# Patient Record
Sex: Female | Born: 1995
Health system: Southern US, Community
[De-identification: ages and names within clinical notes are randomized; demographics above are authoritative.]

## PROBLEM LIST (undated history)

## (undated) DIAGNOSIS — Z915 Personal history of self-harm: Secondary | ICD-10-CM

## (undated) DIAGNOSIS — F913 Oppositional defiant disorder: Secondary | ICD-10-CM

## (undated) DIAGNOSIS — F419 Anxiety disorder, unspecified: Secondary | ICD-10-CM

## (undated) DIAGNOSIS — E669 Obesity, unspecified: Secondary | ICD-10-CM

## (undated) DIAGNOSIS — Z9151 Personal history of suicidal behavior: Secondary | ICD-10-CM

## (undated) DIAGNOSIS — N809 Endometriosis, unspecified: Secondary | ICD-10-CM

## (undated) DIAGNOSIS — F319 Bipolar disorder, unspecified: Secondary | ICD-10-CM

## (undated) DIAGNOSIS — F1910 Other psychoactive substance abuse, uncomplicated: Secondary | ICD-10-CM

## (undated) DIAGNOSIS — F39 Unspecified mood [affective] disorder: Secondary | ICD-10-CM

## (undated) HISTORY — PX: ADENOIDECTOMY: SUR15

## (undated) HISTORY — PX: TONSILLECTOMY AND ADENOIDECTOMY: SUR1326

---
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
## 2017-01-16 ENCOUNTER — Telehealth: Payer: Self-pay | Admitting: *Deleted

## 2017-01-16 ENCOUNTER — Encounter: Payer: Self-pay | Admitting: Women's Health

## 2017-01-16 ENCOUNTER — Ambulatory Visit (INDEPENDENT_AMBULATORY_CARE_PROVIDER_SITE_OTHER): Payer: Medicaid Other | Admitting: Women's Health

## 2017-01-16 VITALS — BP 128/60 | HR 76 | Ht 64.2 in | Wt 229.0 lb

## 2017-01-16 DIAGNOSIS — Z113 Encounter for screening for infections with a predominantly sexual mode of transmission: Secondary | ICD-10-CM

## 2017-01-16 DIAGNOSIS — N920 Excessive and frequent menstruation with regular cycle: Secondary | ICD-10-CM | POA: Diagnosis not present

## 2017-01-16 DIAGNOSIS — A599 Trichomoniasis, unspecified: Secondary | ICD-10-CM | POA: Diagnosis not present

## 2017-01-16 DIAGNOSIS — Z3202 Encounter for pregnancy test, result negative: Secondary | ICD-10-CM

## 2017-01-16 LAB — POCT WET PREP (WET MOUNT): Clue Cells Wet Prep Whiff POC: POSITIVE

## 2017-01-16 LAB — POCT URINE PREGNANCY: Preg Test, Ur: NEGATIVE

## 2017-01-16 MED ORDER — METRONIDAZOLE 500 MG PO TABS: 500.0000 mg | ORAL_TABLET | Freq: Two times a day (BID) | ORAL | 0 refills | Status: DC

## 2017-01-16 NOTE — Patient Instructions (Signed)

## 2017-01-16 NOTE — Progress Notes (Signed)
   [facility] Visit  Patient name: Molly Zimmerman MRN [id number]  Date of birth: 09/16/1995  CC & HPI:  Molly Zimmerman is a 21 y.o. 432P1001 Caucasian female presenting today for report of spotting x 1, brown. No abnormal d/c, itching/odor/irritation. Stopped taking coc's b/c she moved out of town, now is back. Does not want pregnancy right now. Is sexually active.  Patient's last menstrual period was 12/21/2016. The current method of family planning is none. Last pap <21yo  Pertinent History Reviewed:  Medical & Surgical Hx:   Past medical, surgical, family, and social history reviewed in electronic medical record Medications: Reviewed & Updated - see associated section Allergies: Reviewed in electronic medical record  Objective Findings:  Vitals: BP 128/60   Pulse 76   Ht 5' 4.2" (1.631 m)   Wt 229 lb (103.9 kg)   LMP 12/21/2016   BMI 39.06 kg/m  Body mass index is 39.06 kg/m.  Physical Examination: General appearance - alert, well appearing, and in no distress Pelvic - small amt thin white malodorous d/c  Results for orders placed or performed in visit on 01/16/17 (from the past 24 hour(s))  POCT Wet Prep Molly Zimmerman([place]et [place])   Collection Time: 01/16/17  2:44 PM  Result Value Ref Range   Source Wet Prep POC vaginal    WBC, Wet Prep HPF POC few    Bacteria Wet Prep HPF POC None (A) Few   BACTERIA WET PREP MORPHOLOGY POC     Clue Cells Wet Prep HPF POC Few (A) None   Clue Cells Wet Prep Whiff POC Positive Whiff    Yeast Wet Prep HPF POC None    KOH Wet Prep POC     Trichomonas Wet Prep HPF POC Present (A) Absent   UPT: neg  Assessment & Plan:  A:   Trichomonas  P:  Rx metronidazole bid x 7d for trich, no sex or etoh while taking  Pt to call me back w/ partner's info  No sex until after poc  GC/CT today  Restart coc's!  Condoms for STI prevention  Return in about 2 weeks (around 01/30/2017) for F/U. POC  Molly Zimmerman, Molly Zimmerman CNM, [facility] 01/16/2017 2:45  PM

## 2017-01-16 NOTE — Addendum Note (Signed)
Addended by: Federico FlakeNES, Jeancarlo Leffler A on: 01/16/2017 03:16 PM   Modules accepted: Orders

## 2017-01-17 NOTE — Telephone Encounter (Signed)
Metronidazole 500mg  BID x 7d for tx of trichomonas called into [facility] for pt's partner Sharlyne Pacasnthony Jomar Carter DOB 04/04/90, NKDA, no etoh while taking.  Cheral MarkerKimberly R. Rondell Pardon, CNM, [facility]NP-BC 01/17/2017 12:55 PM

## 2017-01-18 ENCOUNTER — Encounter (HOSPITAL_COMMUNITY): Payer: Self-pay

## 2017-01-18 ENCOUNTER — Emergency Department (HOSPITAL_COMMUNITY)
Admission: EM | Admit: 2017-01-18 | Discharge: 2017-01-18 | Disposition: A | Discharge status: Home / Self Care | Payer: Medicaid Other | Attending: Emergency Medicine | Admitting: Emergency Medicine

## 2017-01-18 DIAGNOSIS — Y999 Unspecified external cause status: Secondary | ICD-10-CM | POA: Insufficient documentation

## 2017-01-18 DIAGNOSIS — S70262A Insect bite (nonvenomous), left hip, initial encounter: Secondary | ICD-10-CM | POA: Diagnosis not present

## 2017-01-18 DIAGNOSIS — Y92821 Forest as the place of occurrence of the external cause: Secondary | ICD-10-CM | POA: Diagnosis not present

## 2017-01-18 DIAGNOSIS — X58XXXA Exposure to other specified factors, initial encounter: Secondary | ICD-10-CM | POA: Diagnosis not present

## 2017-01-18 DIAGNOSIS — Y939 Activity, unspecified: Secondary | ICD-10-CM | POA: Diagnosis not present

## 2017-01-18 DIAGNOSIS — R21 Rash and other nonspecific skin eruption: Secondary | ICD-10-CM | POA: Diagnosis present

## 2017-01-18 DIAGNOSIS — W57XXXA Bitten or stung by nonvenomous insect and other nonvenomous arthropods, initial encounter: Secondary | ICD-10-CM

## 2017-01-18 DIAGNOSIS — F17200 Nicotine dependence, unspecified, uncomplicated: Secondary | ICD-10-CM | POA: Insufficient documentation

## 2017-01-18 LAB — GC/CHLAMYDIA PROBE AMP
Chlamydia trachomatis, NAA: NEGATIVE
Neisseria gonorrhoeae by PCR: NEGATIVE

## 2017-01-18 MED ORDER — PREDNISONE 20 MG PO TABS: 40.0000 mg | ORAL_TABLET | Freq: Every day | ORAL | 0 refills | Status: DC

## 2017-01-18 MED ORDER — DIPHENHYDRAMINE HCL 25 MG PO TABS: 25.0000 mg | ORAL_TABLET | ORAL | 0 refills | Status: DC | PRN

## 2017-01-18 NOTE — Discharge Instructions (Signed)
Apply ice packs on/off to the insect bite to your hip.  Avoid scratching.  Return if needed

## 2017-01-18 NOTE — ED Provider Notes (Signed)
[facility] Provider Note   CSN: [id number] Arrival date & time: 01/18/17  0913     History   Chief Complaint Chief Complaint  Patient presents with  . Insect Bite    HPI Molly Zimmerman is a 21 y.o. female.  HPI   Molly Zimmerman is a 21 y.o. female who presents to the Emergency Department complaining of multiple insect bites x 2 days.  She states she was outside in a wooded area few days ago and later noticed several red raised "bumps" to her left hip, leg, face, lower abdomen and wrist.  Complains of itching to the affected areas.  No other household members have rashes.  Denies fever, tick bites, joint pains, swelling or difficulty breathing.  Has not tried any therapies prior to arrival   Past Medical History:  Diagnosis Date  . Anxiety   . Bipolar 1 disorder (HCC)   . History of suicide attempt   . Mood disorder (HCC)   . Obesity   . ODD (oppositional defiant disorder)   . Polysubstance abuse     Patient Active Problem List   Diagnosis Date Noted  . Trichomonas infection 01/16/2017  . Irregular intermenstrual bleeding 08/07/2016  . Chlamydia 07/21/2015  . Encounter for IUD removal 11/17/2014  . History of suicide attempt 12/03/2013  . Marijuana use 10/13/2013  . Polysubstance abuse 09/26/2012  . Oppositional defiant behavior 09/24/2012  . Bipolar 1 disorder, mixed, moderate (HCC) 09/24/2012    Past Surgical History:  Procedure Laterality Date  . ADENOIDECTOMY  Age 21  . TONSILLECTOMY AND ADENOIDECTOMY  Age5    OB History    Gravida Para Term Preterm AB Living   2 1 1     1    SAB TAB Ectopic Multiple Live Births           1       Home Medications    Prior to Admission medications   Medication Sig Start Date End Date Taking? Authorizing Provider  metroNIDAZOLE (FLAGYL) 500 MG tablet Take 1 tablet (500 mg total) by mouth 2 (two) times daily. X 7 days. No sex or alcohol while taking 01/16/17  Yes Cheral MarkerBooker, Kimberly R, CNM  diphenhydrAMINE (BENADRYL)  25 MG tablet Take 1 tablet (25 mg total) by mouth every 4 (four) hours as needed. For itching 01/18/17   Keshan Reha, PA-C  predniSONE (DELTASONE) 20 MG tablet Take 2 tablets (40 mg total) by mouth daily. 01/18/17   Pauline Ausriplett, Paydon Carll, PA-C    Family History Family History  Problem Relation Age of Onset  . ADD / ADHD Father   . Drug abuse Father   . Diabetes Maternal Grandfather   . Heart disease Paternal Grandfather   . Cancer Paternal Grandfather        MGM sister  . Drug abuse Paternal Grandmother     Social History Social History  Substance Use Topics  . Smoking status: Current Every Day Smoker    Packs/day: 0.25  . Smokeless tobacco: Never Used  . Alcohol use No     Allergies   Patient has no known allergies.   Review of Systems Review of Systems  Constitutional: Negative for activity change, appetite change, chills and fever.  HENT: Negative for facial swelling, sore throat and trouble swallowing.   Respiratory: Negative for chest tightness, shortness of breath and wheezing.   Musculoskeletal: Negative for arthralgias, neck pain and neck stiffness.  Skin: Negative for rash and wound.       Insect bites  Neurological: Negative for dizziness, weakness, numbness and headaches.  All other systems reviewed and are negative.    Physical Exam Updated Vital Signs BP 111/73 (BP Location: Left Arm)   Pulse 66   Temp 97.7 F (36.5 C) (Oral)   Resp 18   Ht 5\' 4"  (1.626 m)   Wt 104.3 kg (230 lb)   LMP 12/23/2016   SpO2 97%   BMI 39.48 kg/m   Physical Exam  Constitutional: She is oriented to person, place, and time. She appears well-developed and well-nourished. No distress.  HENT:  Head: Normocephalic and atraumatic.  Mouth/Throat: Oropharynx is clear and moist.  Neck: Normal range of motion. Neck supple.  Cardiovascular: Normal rate, regular rhythm, normal heart sounds and intact distal pulses.   No murmur heard. Pulmonary/Chest: Effort normal and breath  sounds normal. No respiratory distress.  Musculoskeletal: She exhibits no edema or tenderness.  Lymphadenopathy:    She has no cervical adenopathy.  Neurological: She is alert and oriented to person, place, and time. She exhibits normal muscle tone. Coordination normal.  Skin: Skin is warm. Capillary refill takes less than 2 seconds. No rash noted. There is erythema.  Erythematous papules to the lower abdomen, left wrist, right face, left leg and left hip.  Surrounding erythema with papules to the left hip.  No vesicles or pustules  Nursing note and vitals reviewed.    ED Treatments / Results  Labs (all labs ordered are listed, but only abnormal results are displayed) Labs Reviewed - No data to display  EKG  EKG Interpretation None       Radiology No results found.  Procedures Procedures (including critical care time)  Medications Ordered in ED Medications - No data to display   Initial Impression / Assessment and Plan / ED Course  I have reviewed the triage vital signs and the nursing notes.  Pertinent labs & imaging results that were available during my care of the patient were reviewed by me and considered in my medical decision making (see chart for details).     Pt well appearing.  Non-toxic.  Appears c/w insect bites.  Localized erythema of the left hip likely inflammatory.  Doubt cellulitis.  No concerning sx's for herpes.    Final Clinical Impressions(s) / ED Diagnoses   Final diagnoses:  Insect bite, initial encounter    New Prescriptions Discharge Medication List as of 01/18/2017 10:03 AM    START taking these medications   Details  diphenhydrAMINE (BENADRYL) 25 MG tablet Take 1 tablet (25 mg total) by mouth every 4 (four) hours as needed. For itching, Starting Fri 01/18/2017, Print    predniSONE (DELTASONE) 20 MG tablet Take 2 tablets (40 mg total) by mouth daily., Starting Fri 01/18/2017, Print         Evert Wenrich, [place], PA-C 01/18/17 1106      Samuel JesterMcManus, Kathleen, DO 01/20/17 1818

## 2017-01-18 NOTE — ED Triage Notes (Signed)
Pt reports left hip, abdominal, left leg insect bite x2 days. Areas itchy, and red. Pt has history of cellulitis and she is concerned this will lead to that.

## 2017-01-31 ENCOUNTER — Ambulatory Visit: Payer: Medicaid Other | Admitting: Women's Health

## 2017-02-12 ENCOUNTER — Ambulatory Visit: Payer: Medicaid Other | Admitting: Women's Health

## 2017-02-15 ENCOUNTER — Encounter: Payer: Self-pay | Admitting: Women's Health

## 2017-02-15 ENCOUNTER — Ambulatory Visit (INDEPENDENT_AMBULATORY_CARE_PROVIDER_SITE_OTHER): Payer: Medicaid Other | Admitting: Women's Health

## 2017-02-15 VITALS — BP 120/70 | HR 80 | Wt 236.0 lb

## 2017-02-15 DIAGNOSIS — Z30015 Encounter for initial prescription of vaginal ring hormonal contraceptive: Secondary | ICD-10-CM | POA: Diagnosis not present

## 2017-02-15 DIAGNOSIS — A599 Trichomoniasis, unspecified: Secondary | ICD-10-CM | POA: Diagnosis not present

## 2017-02-15 LAB — POCT WET PREP (WET MOUNT)
Clue Cells Wet Prep Whiff POC: NEGATIVE
Trichomonas Wet Prep HPF POC: ABSENT

## 2017-02-15 MED ORDER — ETONOGESTREL-ETHINYL ESTRADIOL 0.12-0.015 MG/24HR VA RING: VAGINAL_RING | VAGINAL | 3 refills | Status: DC

## 2017-02-15 NOTE — Progress Notes (Signed)
   [facility] Visit  Patient name: Molly Zimmerman MRN [id number]  Date of birth: September 25, 1995  CC & HPI:  Molly Zimmerman is a 21 y.o. [id number] Caucasian female presenting today for trichomonas poc.  Patient's last menstrual period was 01/19/2017. The current method of family planning is Ortho-Evra patches weekly rx'd by PCP- hasn't started them yet, discussed they are not as effective w/ wt >198lb. Discussed options- pt wants nuva ring. Smokes 1-2 cigarettes/day. No h/o HTN, DVT/PE, CVA, MI, or migraines w/ aura.  Last pap <23yo  Pertinent History Reviewed:  Medical & Surgical Hx:   Past medical, surgical, family, and social history reviewed in electronic medical record Medications: Reviewed & Updated - see associated section Allergies: Reviewed in electronic medical record  Objective Findings:  Vitals: BP 120/70   Pulse 80   Wt 236 lb (107 kg)   LMP 01/19/2017   BMI 40.51 kg/m  Body mass index is 40.51 kg/m.  Physical Examination: General appearance - alert, well appearing, and in no distress Pelvic - cx normal, small amount white nonodorous d/c  Results for orders placed or performed in visit on 02/15/17 (from the past 24 hour(s))  POCT Wet Prep Molly Zimmerman)   Collection Time: 02/15/17  2:06 PM  Result Value Ref Range   Source Wet Prep POC vaginal    WBC, Wet Prep HPF POC few    Bacteria Wet Prep HPF POC None (A) Few   BACTERIA WET PREP MORPHOLOGY POC     Clue Cells Wet Prep HPF POC None None   Clue Cells Wet Prep Whiff POC Negative Whiff    Yeast Wet Prep HPF POC None    KOH Wet Prep POC     Trichomonas Wet Prep HPF POC Absent Absent     Assessment & Plan:  A:   Trichomonas poc neg  Contraception management  P:  Do not start ortho evra patches d/t ineffectiveness at wt >198lb  Rx nuva ring 3 w/ 3RF, instructed on proper use  Return in about 3 months (around 05/18/2017) for F/U.  Marge Duncans CNM, [facility] 02/15/2017 2:06 PM

## 2017-02-15 NOTE — Patient Instructions (Signed)
Ethinyl Estradiol; Etonogestrel vaginal ring What is this medicine? ETHINYL ESTRADIOL; ETONOGESTREL (ETH in il es tra DYE ole; et oh noe JES trel) vaginal ring is a flexible, vaginal ring used as a contraceptive (birth control method). This medicine combines two types of female hormones, an estrogen and a progestin. This ring is used to prevent ovulation and pregnancy. Each ring is effective for one month. This medicine may be used for other purposes; ask your health care provider or pharmacist if you have questions. COMMON BRAND NAME(S): NuvaRing What should I tell my health care provider before I take this medicine? They need to know if you have or ever had any of these conditions: -abnormal vaginal bleeding -blood vessel disease or blood clots -breast, cervical, endometrial, ovarian, liver, or uterine cancer -diabetes -gallbladder disease -heart disease or recent heart attack -high blood pressure -high cholesterol -kidney disease -liver disease -migraine headaches -stroke -systemic lupus erythematosus (SLE) -tobacco smoker -an unusual or allergic reaction to estrogens, progestins, other medicines, foods, dyes, or preservatives -pregnant or trying to get pregnant -breast-feeding How should I use this medicine? Insert the ring into your vagina as directed. Follow the directions on the prescription label. The ring will remain place for 3 weeks and is then removed for a 1-week break. A new ring is inserted 1 week after the last ring was removed, on the same day of the week. Check often to make sure the ring is still in place, especially before and after sexual intercourse. If the ring was out of the vagina for an unknown amount of time, you may not be protected from pregnancy. Perform a pregnancy test and call your doctor. Do not use more often than directed. A patient package insert for the product will be given with each prescription and refill. Read this sheet carefully each time. The  sheet may change frequently. Contact your pediatrician regarding the use of this medicine in children. Special care may be needed. This medicine has been used in female children who have started having menstrual periods. Overdosage: If you think you have taken too much of this medicine contact a poison control center or emergency room at once. NOTE: This medicine is only for you. Do not share this medicine with others. What if I miss a dose? You will need to replace your vaginal ring once a month as directed. If the ring should slip out, or if you leave it in longer or shorter than you should, contact your health care professional for advice. What may interact with this medicine? Do not take this medicine with the following medication: -dasabuvir; ombitasvir; paritaprevir; ritonavir -ombitasvir; paritaprevir; ritonavir This medicine may also interact with the following medications: -acetaminophen -antibiotics or medicines for infections, especially rifampin, rifabutin, rifapentine, and griseofulvin, and possibly penicillins or tetracyclines -aprepitant -ascorbic acid (vitamin C) -atorvastatin -barbiturate medicines, such as phenobarbital -bosentan -carbamazepine -caffeine -clofibrate -cyclosporine -dantrolene -doxercalciferol -felbamate -grapefruit juice -hydrocortisone -medicines for anxiety or sleeping problems, such as diazepam or temazepam -medicines for diabetes, including pioglitazone -modafinil -mycophenolate -nefazodone -oxcarbazepine -phenytoin -prednisolone -ritonavir or other medicines for HIV infection or AIDS -rosuvastatin -selegiline -soy isoflavones supplements -St. John's wort -tamoxifen or raloxifene -theophylline -thyroid hormones -topiramate -warfarin This list may not describe all possible interactions. Give your health care provider a list of all the medicines, herbs, non-prescription drugs, or dietary supplements you use. Also tell them if you smoke,  drink alcohol, or use illegal drugs. Some items may interact with your medicine. What should I watch for while using   this medicine? Visit your doctor or health care professional for regular checks on your progress. You will need a regular breast and pelvic exam and Pap smear while on this medicine. Use an additional method of contraception during the first cycle that you use this ring. Do not use a diaphragm or female condom, as the ring can interfere with these birth control methods and their proper placement. If you have any reason to think you are pregnant, stop using this medicine right away and contact your doctor or health care professional. If you are using this medicine for hormone related problems, it may take several cycles of use to see improvement in your condition. Smoking increases the risk of getting a blood clot or having a stroke while you are using hormonal birth control, especially if you are more than 19 years old. You are strongly advised not to smoke. This medicine can make your body retain fluid, making your fingers, hands, or ankles swell. Your blood pressure can go up. Contact your doctor or health care professional if you feel you are retaining fluid. This medicine can make you more sensitive to the sun. Keep out of the sun. If you cannot avoid being in the sun, wear protective clothing and use sunscreen. Do not use sun lamps or tanning beds/booths. If you wear contact lenses and notice visual changes, or if the lenses begin to feel uncomfortable, consult your eye care specialist. In some women, tenderness, swelling, or minor bleeding of the gums may occur. Notify your dentist if this happens. Brushing and flossing your teeth regularly may help limit this. See your dentist regularly and inform your dentist of the medicines you are taking. If you are going to have elective surgery, you may need to stop using this medicine before the surgery. Consult your health care professional  for advice. This medicine does not protect you against HIV infection (AIDS) or any other sexually transmitted diseases. What side effects may I notice from receiving this medicine? Side effects that you should report to your doctor or health care professional as soon as possible: -breast tissue changes or discharge -changes in vaginal bleeding during your period or between your periods -chest pain -coughing up blood -dizziness or fainting spells -headaches or migraines -leg, arm or groin pain -severe or sudden headaches -stomach pain (severe) -sudden shortness of breath -sudden loss of coordination, especially on one side of the body -speech problems -symptoms of vaginal infection like itching, irritation or unusual discharge -tenderness in the upper abdomen -vomiting -weakness or numbness in the arms or legs, especially on one side of the body -yellowing of the eyes or skin Side effects that usually do not require medical attention (report to your doctor or health care professional if they continue or are bothersome): -breakthrough bleeding and spotting that continues beyond the 3 initial cycles of pills -breast tenderness -mood changes, anxiety, depression, frustration, anger, or emotional outbursts -increased sensitivity to sun or ultraviolet light -nausea -skin rash, acne, or brown spots on the skin -weight gain (slight) This list may not describe all possible side effects. Call your doctor for medical advice about side effects. You may report side effects to FDA at 1-800-FDA-1088. Where should I keep my medicine? Keep out of the reach of children. Store at room temperature between 15 and 30 degrees C (59 and 86 degrees F) for up to 4 months. The product will expire after 4 months. Protect from light. Throw away any unused medicine after the expiration date. NOTE: This   sheet is a summary. It may not cover all possible information. If you have questions about this medicine, talk  to your doctor, pharmacist, or health care provider.  2018 Elsevier/Gold Standard (2016-02-17 17:00:31)  

## 2017-05-21 ENCOUNTER — Ambulatory Visit: Payer: Medicaid Other | Admitting: Women's Health

## 2017-05-21 ENCOUNTER — Encounter: Payer: Self-pay | Admitting: *Deleted

## 2017-06-28 ENCOUNTER — Emergency Department (HOSPITAL_COMMUNITY)
Admission: EM | Admit: 2017-06-28 | Discharge: 2017-06-28 | Disposition: A | Discharge status: Home / Self Care | Payer: Medicaid Other | Attending: Emergency Medicine | Admitting: Emergency Medicine

## 2017-06-28 ENCOUNTER — Encounter (HOSPITAL_COMMUNITY): Payer: Self-pay | Admitting: Emergency Medicine

## 2017-06-28 ENCOUNTER — Other Ambulatory Visit: Payer: Self-pay

## 2017-06-28 DIAGNOSIS — Z5321 Procedure and treatment not carried out due to patient leaving prior to being seen by health care provider: Secondary | ICD-10-CM | POA: Insufficient documentation

## 2017-06-28 DIAGNOSIS — R1033 Periumbilical pain: Secondary | ICD-10-CM | POA: Diagnosis present

## 2017-06-28 HISTORY — DX: Endometriosis, unspecified: N80.9

## 2017-06-28 NOTE — ED Triage Notes (Signed)
Pt reports woke up with periumbilical pain. Pt reports pain has increased throughout the day. Pt reports nausea, urinary pressure when voiding. Pt reports LMP 06/11/17.

## 2017-06-28 NOTE — ED Notes (Signed)
Not in WR when called 

## 2017-06-28 NOTE — ED Notes (Signed)
Not in waiting room when called.

## 2017-06-29 ENCOUNTER — Emergency Department (HOSPITAL_COMMUNITY): Payer: Medicaid Other

## 2017-06-29 ENCOUNTER — Other Ambulatory Visit: Payer: Self-pay

## 2017-06-29 ENCOUNTER — Emergency Department (HOSPITAL_COMMUNITY)
Admission: EM | Admit: 2017-06-29 | Discharge: 2017-06-29 | Disposition: A | Discharge status: Home / Self Care | Payer: Medicaid Other | Attending: Emergency Medicine | Admitting: Emergency Medicine

## 2017-06-29 ENCOUNTER — Encounter (HOSPITAL_COMMUNITY): Payer: Self-pay | Admitting: *Deleted

## 2017-06-29 DIAGNOSIS — N390 Urinary tract infection, site not specified: Secondary | ICD-10-CM | POA: Diagnosis not present

## 2017-06-29 DIAGNOSIS — Z87891 Personal history of nicotine dependence: Secondary | ICD-10-CM | POA: Diagnosis not present

## 2017-06-29 DIAGNOSIS — N80129 Deep endometriosis of ovary, unspecified ovary: Secondary | ICD-10-CM

## 2017-06-29 DIAGNOSIS — R109 Unspecified abdominal pain: Secondary | ICD-10-CM | POA: Diagnosis present

## 2017-06-29 DIAGNOSIS — N809 Endometriosis, unspecified: Secondary | ICD-10-CM | POA: Diagnosis not present

## 2017-06-29 LAB — CBC WITH DIFFERENTIAL/PLATELET
Basophils Absolute: 0 10*3/uL (ref 0.0–0.1)
Basophils Relative: 0 %
Eosinophils Absolute: 0 10*3/uL (ref 0.0–0.7)
Eosinophils Relative: 0 %
HCT: 38.9 % (ref 36.0–46.0)
Hemoglobin: 12.6 g/dL (ref 12.0–15.0)
Lymphocytes Relative: 20 %
Lymphs Abs: 1.8 10*3/uL (ref 0.7–4.0)
MCH: 28.5 pg (ref 26.0–34.0)
MCHC: 32.4 g/dL (ref 30.0–36.0)
MCV: 88 fL (ref 78.0–100.0)
Monocytes Absolute: 0.7 10*3/uL (ref 0.1–1.0)
Monocytes Relative: 8 %
Neutro Abs: 6.2 10*3/uL (ref 1.7–7.7)
Neutrophils Relative %: 72 %
Platelets: 182 10*3/uL (ref 150–400)
RBC: 4.42 MIL/uL (ref 3.87–5.11)
RDW: 12.8 % (ref 11.5–15.5)
WBC: 8.7 10*3/uL (ref 4.0–10.5)

## 2017-06-29 LAB — URINALYSIS, ROUTINE W REFLEX MICROSCOPIC
Bilirubin Urine: NEGATIVE
Glucose, UA: NEGATIVE mg/dL
Hgb urine dipstick: NEGATIVE
Ketones, ur: NEGATIVE mg/dL
Nitrite: NEGATIVE
Protein, ur: NEGATIVE mg/dL
Specific Gravity, Urine: >1.046 — ABNORMAL HIGH (ref 1.005–1.030)
pH: 5 (ref 5.0–8.0)

## 2017-06-29 LAB — COMPREHENSIVE METABOLIC PANEL
ALT: 10 U/L — ABNORMAL LOW (ref 14–54)
AST: 15 U/L (ref 15–41)
Albumin: 4 g/dL (ref 3.5–5.0)
Alkaline Phosphatase: 71 U/L (ref 38–126)
Anion gap: 9 (ref 5–15)
BUN: 9 mg/dL (ref 6–20)
CO2: 23 mmol/L (ref 22–32)
Calcium: 8.9 mg/dL (ref 8.9–10.3)
Chloride: 107 mmol/L (ref 101–111)
Creatinine, Ser: 0.73 mg/dL (ref 0.44–1.00)
GFR calc Af Amer: >60 mL/min (ref >60)
GFR calc non Af Amer: >60 mL/min (ref >60)
Glucose, Bld: 89 mg/dL (ref 65–99)
Potassium: 3.5 mmol/L (ref 3.5–5.1)
Sodium: 139 mmol/L (ref 135–145)
Total Bilirubin: 0.6 mg/dL (ref 0.3–1.2)
Total Protein: 7.1 g/dL (ref 6.5–8.1)

## 2017-06-29 LAB — LIPASE, BLOOD: Lipase: 16 U/L (ref 11–51)

## 2017-06-29 LAB — I-STAT BETA HCG BLOOD, ED (MC, WL, AP ONLY): I-stat hCG, quantitative: <5 m[IU]/mL (ref <5)

## 2017-06-29 MED ORDER — CEPHALEXIN 500 MG PO CAPS
500.0000 mg | ORAL_CAPSULE | Freq: Once | ORAL | Status: AC
  Administered 2017-06-29: 500 mg via ORAL
  Filled 2017-06-29: qty 1

## 2017-06-29 MED ORDER — TRAMADOL HCL 50 MG PO TABS: 50.0000 mg | ORAL_TABLET | Freq: Four times a day (QID) | ORAL | 0 refills | Status: DC | PRN

## 2017-06-29 MED ORDER — CEPHALEXIN 500 MG PO CAPS: 500.0000 mg | ORAL_CAPSULE | Freq: Four times a day (QID) | ORAL | 0 refills | Status: DC

## 2017-06-29 MED ORDER — IBUPROFEN 600 MG PO TABS: 600.0000 mg | ORAL_TABLET | Freq: Four times a day (QID) | ORAL | 0 refills | Status: DC

## 2017-06-29 MED ORDER — ONDANSETRON HCL 4 MG/2ML IJ SOLN
4.0000 mg | Freq: Once | INTRAMUSCULAR | Status: AC
Start: 2017-06-29 — End: 2017-06-29
  Administered 2017-06-29: 4 mg via INTRAVENOUS
  Filled 2017-06-29: qty 2

## 2017-06-29 MED ORDER — IOPAMIDOL (ISOVUE-300) INJECTION 61%
100.0000 mL | Freq: Once | INTRAVENOUS | Status: AC | PRN
  Administered 2017-06-29: 100 mL via INTRAVENOUS

## 2017-06-29 MED ORDER — MORPHINE SULFATE (PF) 4 MG/ML IV SOLN
4.0000 mg | Freq: Once | INTRAVENOUS | Status: AC
  Administered 2017-06-29: 4 mg via INTRAVENOUS
  Filled 2017-06-29: qty 1

## 2017-06-29 NOTE — ED Triage Notes (Signed)
Pt c/o mid and lower abdominal pain that started yesterday. Pt c/o slight nausea, no vomiting. 1 episode of diarrhea this morning.

## 2017-06-29 NOTE — ED Provider Notes (Signed)
[facility] EMERGENCY DEPARTMENT Provider Note   CSN: [id number] Arrival date & time: 06/29/17  4098     History   Chief Complaint Chief Complaint  Patient presents with  . Abdominal Pain    HPI Molly Zimmerman is a 22 y.o. female.  Patient is a 22 year old female who presents to the emergency department with mid to lower abdomen pain.  The patient states this problem started on yesterday January 4.  She states the pain is getting worse.  She has nausea, but no actual vomiting.  She had one episode of diarrhea this morning.  Last menstrual cycle was June 11, 2017.  The patient is not had any recent operations or procedures involving her female organs.  Or her abdomen.  She denies vaginal discharge and she denies dysuria.  She presents to the emergency department now for assistance.      Past Medical History:  Diagnosis Date  . Anxiety   . Bipolar 1 disorder (HCC)   . Endometriosis   . History of suicide attempt   . Mood disorder (HCC)   . Obesity   . ODD (oppositional defiant disorder)   . Polysubstance abuse [facility])     Patient Active Problem List   Diagnosis Date Noted  . Trichomonas infection 01/16/2017  . Irregular intermenstrual bleeding 08/07/2016  . Chlamydia 07/21/2015  . Encounter for IUD removal 11/17/2014  . History of suicide attempt 12/03/2013  . Marijuana use 10/13/2013  . Polysubstance abuse (HCC) 09/26/2012  . Oppositional defiant behavior 09/24/2012  . Bipolar 1 disorder, mixed, moderate (HCC) 09/24/2012    Past Surgical History:  Procedure Laterality Date  . ADENOIDECTOMY  Age 69  . TONSILLECTOMY AND ADENOIDECTOMY  Age5    OB History    Gravida Para Term Preterm AB Living   2 1 1     1    SAB TAB Ectopic Multiple Live Births           1       Home Medications    Prior to Admission medications   Medication Sig Start Date End Date Taking? Authorizing Provider  etonogestrel-ethinyl estradiol (NUVARING) 0.12-0.015 MG/24HR vaginal ring  Insert vaginally and leave in place for 3 consecutive weeks, then remove for 1 week. 02/15/17  Yes Cheral Marker, CNM  diphenhydrAMINE (BENADRYL) 25 MG tablet Take 1 tablet (25 mg total) by mouth every 4 (four) hours as needed. For itching Patient not taking: Reported on 02/15/2017 01/18/17   Triplett, Tammy, PA-C  metroNIDAZOLE (FLAGYL) 500 MG tablet Take 1 tablet (500 mg total) by mouth 2 (two) times daily. X 7 days. No sex or alcohol while taking Patient not taking: Reported on 02/15/2017 01/16/17   Cheral Marker, CNM  predniSONE (DELTASONE) 20 MG tablet Take 2 tablets (40 mg total) by mouth daily. Patient not taking: Reported on 02/15/2017 01/18/17   Pauline Aus, PA-C    Family History Family History  Problem Relation Age of Onset  . ADD / ADHD Father   . Drug abuse Father   . Diabetes Maternal Grandfather   . Heart disease Paternal Grandfather   . Cancer Paternal Grandfather        MGM sister  . Drug abuse Paternal Grandmother     Social History Social History   Tobacco Use  . Smoking status: Former Smoker    Packs/day: 0.25  . Smokeless tobacco: Never Used  Substance Use Topics  . Alcohol use: Yes    Comment: once weekly   .  Drug use: Yes    Types: Marijuana    Comment: last used 06/27/17     Allergies   Patient has no known allergies.   Review of Systems Review of Systems  Constitutional: Negative for activity change.       All ROS Neg except as noted in HPI  HENT: Negative for nosebleeds.   Eyes: Negative for photophobia and discharge.  Respiratory: Negative for cough, shortness of breath and wheezing.   Cardiovascular: Negative for chest pain and palpitations.  Gastrointestinal: Positive for abdominal pain and nausea. Negative for blood in stool.  Genitourinary: Negative for dysuria, frequency and hematuria.  Musculoskeletal: Negative for arthralgias, back pain and neck pain.  Skin: Negative.   Neurological: Negative for dizziness, seizures and  speech difficulty.  Psychiatric/Behavioral: Negative for confusion and hallucinations.     Physical Exam Updated Vital Signs BP 114/67   Pulse (!) 58   Temp 98.7 F (37.1 C) (Oral)   Resp 16   Ht 5\' 4"  (1.626 m)   Wt 104.3 kg (230 lb)   LMP 06/11/2017 Comment: neg hcg  SpO2 98%   BMI 39.48 kg/m   Physical Exam  Constitutional: She is oriented to person, place, and time. She appears well-developed and well-nourished.  Non-toxic appearance.  HENT:  Head: Normocephalic.  Right Ear: Tympanic membrane and external ear normal.  Left Ear: Tympanic membrane and external ear normal.  Eyes: EOM and lids are normal. Pupils are equal, round, and reactive to light.  Neck: Normal range of motion. Neck supple. Carotid bruit is not present.  Cardiovascular: Normal rate, regular rhythm, normal heart sounds, intact distal pulses and normal pulses.  Pulmonary/Chest: Breath sounds normal. No respiratory distress.  Abdominal: Soft. Bowel sounds are normal. There is tenderness in the right lower quadrant, periumbilical area, suprapubic area and left lower quadrant. There is no rigidity, no guarding and no CVA tenderness.  Musculoskeletal: Normal range of motion.  Lymphadenopathy:       Head (right side): No submandibular adenopathy present.       Head (left side): No submandibular adenopathy present.    She has no cervical adenopathy.  Neurological: She is alert and oriented to person, place, and time. She has normal strength. No cranial nerve deficit or sensory deficit.  Skin: Skin is warm and dry.  Psychiatric: She has a normal mood and affect. Her speech is normal.  Nursing note and vitals reviewed.    ED Treatments / Results  Labs (all labs ordered are listed, but only abnormal results are displayed) Labs Reviewed  COMPREHENSIVE METABOLIC PANEL - Abnormal; Notable for the following components:      Result Value   ALT 10 (*)    All other components within normal limits  LIPASE, BLOOD    CBC WITH DIFFERENTIAL/PLATELET  URINALYSIS, ROUTINE W REFLEX MICROSCOPIC  I-STAT BETA HCG BLOOD, ED ([facility], [facility], [facility] ONLY)    EKG  EKG Interpretation None       Radiology Ct Abdomen Pelvis W Contrast  Result Date: 06/29/2017 CLINICAL DATA:  History of endometriosis, now with mid and lower abdominal pain. Nausea and diarrhea. EXAM: CT ABDOMEN AND PELVIS WITH CONTRAST TECHNIQUE: Multidetector CT imaging of the abdomen and pelvis was performed using the standard protocol following bolus administration of intravenous contrast. CONTRAST:  ISOVUE-300 IOPAMIDOL (ISOVUE-300) INJECTION 61% COMPARISON:  None. FINDINGS: Lower chest: Limited visualization of lower thorax demonstrates minimal dependent subpleural ground-glass atelectasis. No discrete focal airspace opacities. No pleural effusion. Normal heart size.  No pericardial effusion. Hepatobiliary: Normal hepatic contour. There is a minimal amount of focal fatty infiltration adjacent to the fissure for ligamentum teres. No discrete hepatic lesions. Normal noncontrast appearance of the gallbladder. No gallstones. No gallbladder wall thickening or pericholecystic fluid. No intra or extrahepatic biliary dilatation. No ascites. Pancreas: Normal appearance of the pancreas Spleen: There is a punctate granuloma within the splenic hilum (image 16, series 2). The spleen is minimally enlarged measuring 15.4 cm in length. Note is made of a prominent splenule about the caudal aspect of the anterior tip of the spleen. No perisplenic stranding. Adrenals/Urinary Tract: Normal appearance of the bilateral kidneys. No definite renal stones this postcontrast examination. No discrete renal lesions. No urine obstruction or perinephric stranding. Normal appearance the bilateral adrenal glands. Normal appearance the urinary bladder given degree distention. Stomach/Bowel: The bowel is normal in course and caliber without wall thickening or evidence of enteric obstruction. Normal  appearance of the terminal ileum and appendix (coronal image 47, series 5). No pneumoperitoneum, pneumatosis or portal venous gas. Vascular/Lymphatic: Normal caliber of the abdominal aorta. The major branch vessels of the abdominal aorta appear patent on this non CTA examination. Scattered retroperitoneal and mesenteric lymph nodes are numerous though individually not enlarged by size criteria. Reproductive: Normal appearance of the pelvic organs. No discrete adnexal lesion. No free fluid the pelvic cul-de-sac. Other: There is a minimal amount of stranding about anterior inferior aspect of the omentum (image 60, series 2) with an adjacent approximately 2.0 x 0.9 x 0.9 cm omental implant (axial image 68, series 2, sagittal image 54, series 6). Musculoskeletal: No acute or aggressive osseous abnormalities. IMPRESSION: 1. Punctate (approximately 0.2 x 0.9 x 0.9 cm) implant within the caudal aspect of the omentum with associated minimal amount of adjacent mesenteric stranding. This structure is indeterminate though given provided history of endometriosis and patient's young age, this structure is favored to represent an endometrioma. Further evaluation could be performed with contrast-enhanced pelvic MRI as indicated. 2. Otherwise, no explanation for patient's mid to lower abdominal pain. Specifically, no evidence of enteric urinary obstruction. Normal appearance of the appendix. Otherwise normal appearance of the pelvic organs. Electronically Signed   By: Simonne ComeJohn  Watts M.D.   On: 06/29/2017 10:35    Procedures Procedures (including critical care time)  Medications Ordered in ED Medications  ondansetron (ZOFRAN) injection 4 mg (4 mg Intravenous Given 06/29/17 0950)  morphine 4 MG/ML injection 4 mg (4 mg Intravenous Given 06/29/17 0950)  iopamidol (ISOVUE-300) 61 % injection 100 mL (100 mLs Intravenous Contrast Given 06/29/17 1006)     Initial Impression / Assessment and Plan / ED Course  I have reviewed the  triage vital signs and the nursing notes.  Pertinent labs & imaging results that were available during my care of the patient were reviewed by me and considered in my medical decision making (see chart for details).       Final Clinical Impressions(s) / ED Diagnoses MDM Vital signs within normal limits on admission.  Patient states she has pain in her lower abdomen with walking and also with change of position.  She has had some nausea, but no actual vomiting.  She denies blood in her stools and denies blood in her urine.  Patient complains of advancing pain and request medication.  IV morphine given to the patient with some improvement of the pain. I-STAT beta hCG is negative for pregnancy.  Conference of metabolic panel is well within normal limits.  The anion gap is 9.  The complete blood count is well within normal limits. Urinalysis shows a cloudy yellow specimen with a specific gravity of greater than 1.046.  There is a large leukocyte esterase present with 6-30 white blood cells and 6-30 red blood cells.  There is rare bacteria present, and there is too numerous to count epithelial cells present.  A culture has been sent to the lab.  CT scan of the abdomen reveals a punctate implant within the caudal aspect of the omentum associated with mesenteric stranding.  There is question if this might be an endometrioma since the patient has had some problems with endometriosis in the past.  The remainder of the CT scan is within normal limits.  I discussed the findings with the patient in terms which he understands.  Questions were answered.  Have asked the patient to see her GYN physician or to see the GYN physician on call as soon as possible.  I also discussed the urinary tract infection with the patient.  The patient will use Keflex 4 times daily.  Patient will use ibuprofen 4 times daily, short course of Ultram is also been given to use for pain.  Patient acknowledges understanding of the  discharge instructions.    Final diagnoses:  Endometrioma  Urinary tract infection without hematuria, site unspecified    ED Discharge Orders        Ordered    cephALEXin (KEFLEX) 500 MG capsule  4 times daily     06/29/17 1143    traMADol (ULTRAM) 50 MG tablet  Every 6 hours PRN     06/29/17 1143    ibuprofen (ADVIL,MOTRIN) 600 MG tablet  4 times daily     06/29/17 1143       Ivery Quale, PA-C 06/29/17 1951    Donnetta Hutching, MD 06/30/17 2234

## 2017-06-29 NOTE — ED Notes (Signed)
Pt made aware of need for urine sample. Pt states she does not have to go at this time but will provide one as soon as able.

## 2017-06-29 NOTE — Discharge Instructions (Signed)
Your vital signs and blood test are within normal limits.  Your urine shows that you are mildly dehydrated.  It is important that you increase your water, Gatorade, juices, etc.  Your test also suggest a possible urinary tract infection.  Please use Keflex with breakfast, lunch, dinner, and at bedtime.  Your CT scan suggest an endometrioma.  Please see Dr. Emelda FearFerguson or the GYN physician of your choice concerning this as soon as possible.  Please use ibuprofen with breakfast, lunch, dinner, and at bedtime.  May use Ultram for more severe pain.  Ultram may cause drowsiness.  Please do not drive, drink alcohol, operate machinery, or participate in activities requiring concentration when taking this medication.

## 2017-06-29 NOTE — ED Notes (Signed)
Pt instructed not to drive or operate machinery for 4-6 hours after receiving morphine.  Pt says her mother will come pick her up from the hospital.

## 2017-07-01 ENCOUNTER — Telehealth: Payer: Self-pay | Admitting: *Deleted

## 2017-07-01 LAB — URINE CULTURE

## 2017-07-01 NOTE — Telephone Encounter (Signed)
Attempted to call patient. Heard busy tone.

## 2017-07-05 ENCOUNTER — Telehealth: Payer: Self-pay | Admitting: *Deleted

## 2017-07-05 ENCOUNTER — Telehealth: Payer: Self-pay | Admitting: Obstetrics & Gynecology

## 2017-07-05 MED ORDER — PROMETHAZINE HCL 25 MG PO TABS: 25.0000 mg | ORAL_TABLET | Freq: Four times a day (QID) | ORAL | 1 refills | Status: DC | PRN

## 2017-07-05 NOTE — Telephone Encounter (Signed)
Patient was seen in ED on 06/29/17 and was diagnosed with endometrioma. She was given Ultram and is out but is taking Ibuprofen which she says helps.  She was experiencing nausea but is now vomiting which is causing her more pain. She is requesting something for nausea to help control her pain.  Scheduled to see Dr Emelda FearFerguson on 07/10/17. Please advise.

## 2017-07-05 NOTE — Telephone Encounter (Signed)
Phenergan has been e prescribed

## 2017-07-05 NOTE — Telephone Encounter (Signed)
Patient informed Phenergan was sent to pharmacy. Advised it may cause drowsiness so not to drive after taking it.  Verbalized understanding.

## 2017-07-05 NOTE — Telephone Encounter (Signed)
Meds ordered this encounter  Medications  . promethazine (PHENERGAN) 25 MG tablet    Sig: Take 1 tablet (25 mg total) by mouth every 6 (six) hours as needed for nausea or vomiting.    Dispense:  30 tablet    Refill:  1    

## 2017-07-10 ENCOUNTER — Ambulatory Visit (INDEPENDENT_AMBULATORY_CARE_PROVIDER_SITE_OTHER): Payer: Medicaid Other | Admitting: Obstetrics and Gynecology

## 2017-07-10 ENCOUNTER — Encounter: Payer: Self-pay | Admitting: Obstetrics and Gynecology

## 2017-07-10 DIAGNOSIS — E669 Obesity, unspecified: Secondary | ICD-10-CM | POA: Diagnosis not present

## 2017-07-10 DIAGNOSIS — Z713 Dietary counseling and surveillance: Secondary | ICD-10-CM | POA: Diagnosis not present

## 2017-07-10 DIAGNOSIS — F319 Bipolar disorder, unspecified: Secondary | ICD-10-CM | POA: Diagnosis not present

## 2017-07-10 DIAGNOSIS — Z30016 Encounter for initial prescription of transdermal patch hormonal contraceptive device: Secondary | ICD-10-CM | POA: Diagnosis not present

## 2017-07-10 DIAGNOSIS — R102 Pelvic and perineal pain: Secondary | ICD-10-CM

## 2017-07-10 NOTE — Progress Notes (Addendum)
Patient ID: [name], female   DOB: 1996/06/17, 22 y.o.   MRN: [id number]   [facility] Clinic Visit  @DATE @            Patient name: Molly Zimmerman MRN [id number]  Date of birth: [birth year]  CC & HPI:  Molly Zimmerman is a 22 y.o. female presenting today for follow up of her ED visit on 06/29/2017. Her initial complaint was severe midline lower abdominal pain that started around 06/25/2017. Associated symptoms included chills. Her last menstrual cycle was 06/11/2017 which it only lasted a few days before spotting. She reports starting her period this morning. Her period pain is not as bad as the pain she felt on the 5th. After her ED visit she was taking tramadol that mildly alleviated her pain. Her bowel movements have been closer to diarrhea and she states she will normally have one BM a day. After her first pregnancy she was hospitalized for a week for an infection in her uterine lining. She is skeptical of going on birth control, but she doesn't want to have another baby at this time. She did like using the patch and is willing to go back on that again.   Secondarily she is interested in her weight los options. She states that she has started walking 2 miles a day. She ads that she doesn't eat a lot, but what she does eat is not the healthiest.   She denies fever or any other symptoms at this time.   ROS:  ROS +abdominal pain +chills -fever All systems are negative except as noted in the HPI and PMH.   Pertinent History Reviewed:   Reviewed: Significant for endometriosis Medical         Past Medical History:  Diagnosis Date  . Anxiety   . Bipolar 1 disorder (HCC)   . Endometriosis   . History of suicide attempt   . Mood disorder (HCC)   . Obesity   . ODD (oppositional defiant disorder)   . Polysubstance abuse [facility]                               Surgical Hx:    Past Surgical History:  Procedure Laterality Date  . ADENOIDECTOMY  Age 60  . TONSILLECTOMY AND ADENOIDECTOMY  Age5    Medications: Reviewed & Updated - see associated section                       Current Outpatient Medications:  .  cephALEXin (KEFLEX) 500 MG capsule, Take 1 capsule (500 mg total) by mouth 4 (four) times daily., Disp: 20 capsule, Rfl: 0 .  diphenhydrAMINE (BENADRYL) 25 MG tablet, Take 1 tablet (25 mg total) by mouth every 4 (four) hours as needed. For itching (Patient not taking: Reported on 02/15/2017), Disp: 20 tablet, Rfl: 0 .  etonogestrel-ethinyl estradiol (NUVARING) 0.12-0.015 MG/24HR vaginal ring, Insert vaginally and leave in place for 3 consecutive weeks, then remove for 1 week., Disp: 3 each, Rfl: 3 .  ibuprofen (ADVIL,MOTRIN) 600 MG tablet, Take 1 tablet (600 mg total) by mouth 4 (four) times daily., Disp: 30 tablet, Rfl: 0 .  metroNIDAZOLE (FLAGYL) 500 MG tablet, Take 1 tablet (500 mg total) by mouth 2 (two) times daily. X 7 days. No sex or alcohol while taking (Patient not taking: Reported on 02/15/2017), Disp: 14 tablet, Rfl: 0 .  predniSONE (DELTASONE) 20 MG tablet, Take 2 tablets (  40 mg total) by mouth daily. (Patient not taking: Reported on 02/15/2017), Disp: 10 tablet, Rfl: 0 .  promethazine (PHENERGAN) 25 MG tablet, Take 1 tablet (25 mg total) by mouth every 6 (six) hours as needed for nausea or vomiting., Disp: 30 tablet, Rfl: 1 .  traMADol (ULTRAM) 50 MG tablet, Take 1 tablet (50 mg total) by mouth every 6 (six) hours as needed., Disp: 12 tablet, Rfl: 0   Social History: Reviewed -  reports that she has quit smoking. She smoked 0.25 packs per day. she has never used smokeless tobacco.  Objective Findings:  Vitals: Last menstrual period 06/11/2017. Body mass index is 40.47 kg/m.  PHYSICAL EXAMINATION General appearance - alert, well appearing, and in no distress, oriented to person, place, and time and overweight Mental status - alert, oriented to person, place, and time, normal mood, behavior, speech, dress, motor activity, and thought processes, affect appropriate to  mood CT reviewed the questionable "endometrioma" is a 0.9 cm area in the omentum, very nonspecific.  The patient's pain complaints did not match premenstrual timeframe so endometrial mass seems low on the differential of possible etiology PELVIC DEFERRED CT results as follows:   IMPRESSION: 1. Punctate (approximately 0.2 x 0.9 x 0.9 cm) implant within the caudal aspect of the omentum with associated minimal amount of adjacent mesenteric stranding. This structure is indeterminate though given provided history of endometriosis and patient's young age, this structure is favored to represent an endometrioma. Further evaluation could be performed with contrast-enhanced pelvic MRI as indicated. 2. Otherwise, no explanation for patient's mid to lower abdominal pain. Specifically, no evidence of enteric urinary obstruction. Normal appearance of the appendix. Otherwise normal appearance of the pelvic organs.   Electronically Signed   By: Simonne ComeJohn  Watts M.D.   On: 06/29/2017 10:35   Discussion: 1. Discussed with pt risks and benefits of contraception patient desires to restart transdermal estrogen progesterone birth control patch  2.  Nonspecific single 0.9 cm area in the omentum described as containing mesenteric stranding, without ascites or adenopathy  At end of discussion, pt had opportunity to ask questions and has no further questions at this time.   Specific discussion of contraception as noted above. Greater than 50% was spent in counseling and coordination of care with the patient.   Total time greater than: 25 minutes.    Assessment & Plan:   A:  1.  Contraception management, begin Ortho Evra patch 2.  Bipolar, stable 3.  Resolved midline lower abdominal pain 4.  Nonspecific 0.9 cm area in the omentum 5.  Desire for weight loss P:  1.  Ortho Evra prescription to be called in 2.  Follow-up of abdominal pain symptoms as needed recurrence 3.  General overview of weight  care management discussed with patient including weight loss information, including at name of apps   By signing my name below, I, Diona BrownerJennifer Gorman, attest that this documentation has been prepared under the direction and in the presence of Tilda BurrowFerguson, Juanisha Bautch V, MD. Electronically Signed: Diona BrownerJennifer Gorman, Medical Scribe. 07/10/17. 12:08 PM.  I personally performed the services described in this documentation, which was SCRIBED in my presence. The recorded information has been reviewed and considered accurate. It has been edited as necessary during review. Tilda BurrowJohn V Braiden Rodman, MD

## 2017-07-10 NOTE — Patient Instructions (Signed)

## 2017-07-31 ENCOUNTER — Ambulatory Visit (INDEPENDENT_AMBULATORY_CARE_PROVIDER_SITE_OTHER): Payer: Medicaid Other | Admitting: Obstetrics and Gynecology

## 2017-07-31 ENCOUNTER — Encounter: Payer: Self-pay | Admitting: Obstetrics and Gynecology

## 2017-07-31 VITALS — BP 110/72 | HR 81 | Ht 64.5 in | Wt 240.5 lb

## 2017-07-31 DIAGNOSIS — R451 Restlessness and agitation: Secondary | ICD-10-CM | POA: Diagnosis not present

## 2017-07-31 DIAGNOSIS — F419 Anxiety disorder, unspecified: Secondary | ICD-10-CM

## 2017-07-31 DIAGNOSIS — F3162 Bipolar disorder, current episode mixed, moderate: Secondary | ICD-10-CM | POA: Diagnosis not present

## 2017-07-31 NOTE — Progress Notes (Signed)
   [facility]  @DATE @            Patient name: Molly Zimmerman Matheny MRN [id number]  Date of birth: 04/14/1996  CC & HPI:  Molly Zimmerman Benda is a 22 y.o. female presenting today for anxiety and agitation, flareup of her bipolar 871 22 year old seen last month for contraception management.  She has not picked up her medicines but "been too busy".  She recognizes that she is somewhat out of control with her anxiety and agitation.  She has not been on her Resperdol for her bipolar 1 [facility] indicates that she is not filled the medicine in 6 weeks [facility]  indicates that she is eligible to go out there for same-day appointment as a new patient Patient given information  ROS:  ROS   Pertinent History Reviewed:   Reviewed: Significant for bipolar 1 Medical         Past Medical History:  Diagnosis Date  . Anxiety   . Bipolar 1 disorder (HCC)   . Endometriosis   . History of suicide attempt   . Mood disorder (HCC)   . Obesity   . ODD (oppositional defiant disorder)   . Polysubstance abuse [facility](HCC)                               Surgical Hx:    Past Surgical History:  Procedure Laterality Date  . ADENOIDECTOMY  Age 22  . TONSILLECTOMY AND ADENOIDECTOMY  Age5   Medications: Reviewed & Updated - see associated section                      No current outpatient medications on file.   Social History: Reviewed -  reports that she has been smoking cigarettes.  She has been smoking about 0.25 packs per day. she has never used smokeless tobacco.  Objective Findings:  Vitals: Blood pressure 110/72, pulse 81, height 5' 4.5" (1.638 m), weight 240 lb 8 oz (109.1 kg), last menstrual period 07/10/2017, not currently breastfeeding.  PHYSICAL EXAMINATION General appearance - overweight, anxious and hyperactive Mental status - alert, oriented to person, place, and time, affect appropriate to mood, agitated Chest -unlabored  breathing Heart - {heart exam:315510::"normal rate, regular rhythm, normal S1, S2, no m PELVIC not a indicated   Assessment & Plan:   A:  1. Bipolar 1 with flareup, not currently on medicines  P:  1. Referred to [facility] [place] for [place] appointment for today

## 2017-07-31 NOTE — Assessment & Plan Note (Signed)
Referred back to [name] in [place] 07/31/2017

## 2017-07-31 NOTE — Progress Notes (Signed)
Patient ID: Molly Zimmerman, female   DOB: 10/04/1995, 22 y.o.   MRN: [id number]   [facility]  @DATE @            Patient name: Molly Zimmerman MRN [id number]  Date of birth: 05/29/1996  CC & HPI:  Molly Zimmerman is a 22 y.o. female presenting today to discuss her weight and she is unsure who to go to. She states she has been very busy and that she brielfy tried to better manage her diet, but not as well as she could have. She also complains of worsening anxiety. She has tried to get into other doctors, but is unable to get an appointment until a few weeks out. She notes that her anxiety has become a big problem especially with her staying focused and motivated with school. She denies fever, chills or any other symptoms or complaints at this time.  ROS:  ROS +weight management +anxiety -fever -chills All systems are negative except as noted in the HPI and PMH.   Pertinent History Reviewed:   Reviewed: Significant for anxiety, bipolar 1 Medical         Past Medical History:  Diagnosis Date  . Anxiety   . Bipolar 1 disorder (HCC)   . Endometriosis   . History of suicide attempt   . Mood disorder (HCC)   . Obesity   . ODD (oppositional defiant disorder)   . Polysubstance abuse [facility](HCC)                               Surgical Hx:    Past Surgical History:  Procedure Laterality Date  . ADENOIDECTOMY  Age 22  . TONSILLECTOMY AND ADENOIDECTOMY  Age5   Medications: Reviewed & Updated - see associated section                      No current outpatient medications on file.   Social History: Reviewed -  reports that she has been smoking cigarettes.  She has been smoking about 0.25 packs per day. she has never used smokeless tobacco.  Objective Findings:  Vitals: Blood pressure 110/72, pulse 81, height 5' 4.5" (1.638 m), weight 240 lb 8 oz (109.1 kg), last menstrual period 07/10/2017, not currently breastfeeding.  PHYSICAL EXAMINATION General appearance - alert, well appearing, and  in no distress, oriented to person, place, and time and overweight Mental status - alert, oriented to person, place, and time, normal mood, behavior, speech, dress, motor activity, and thought processes, anxious  PELVIC DEFERRED  Discussion: 1. Discussed with pt risks of anxiety  At end of discussion, pt had opportunity to ask questions and has no further questions at this time.   Specific discussion of anxiety as noted above. Greater than 50% was spent in counseling and coordination of care with the patient.   Total time greater than: 25 minutes.    Assessment & Plan:   A:  1. Anxiety 2. Bipolar disorder type I not on current therapy  P:  1. [facility] not [facility] age [facility] in [facility] 2. Referral today Mark.  Patient given information to seek appointment today for walk-in new patient appointment  By signing my name below, I, Diona BrownerJennifer Gorman, attest that this documentation has been prepared under the direction and in the presence of Tilda BurrowFerguson, Anuoluwapo Mefferd V, MD. Electronically Signed: Diona BrownerJennifer Gorman, Medical Scribe. 07/31/17. 10:30  AM.  I personally performed the services described in this documentation, which was SCRIBED in my presence. The recorded information has been reviewed and considered accurate. It has been edited as necessary during review. Jonnie Kind, MD

## 2017-08-23 ENCOUNTER — Telehealth: Payer: Self-pay | Admitting: Obstetrics and Gynecology

## 2017-08-23 NOTE — Telephone Encounter (Signed)
Left message @ 1:43 pm. JSY

## 2017-08-23 NOTE — Telephone Encounter (Signed)
Patient called stating that she came into the office for an appointment and [facility] but they state that they never received tham please contact pt

## 2017-08-26 NOTE — Telephone Encounter (Signed)
Left message x 2. JSY 

## 2017-08-27 NOTE — Telephone Encounter (Signed)
Left message x 3. JSY 

## 2017-08-27 NOTE — Telephone Encounter (Signed)
3 messages left for pt to return call. Never heard from pt. Encounter closed. JSY 

## 2017-10-22 ENCOUNTER — Ambulatory Visit (INDEPENDENT_AMBULATORY_CARE_PROVIDER_SITE_OTHER): Payer: Medicaid Other | Admitting: Adult Health

## 2017-10-22 ENCOUNTER — Encounter: Payer: Self-pay | Admitting: Adult Health

## 2017-10-22 VITALS — BP 128/66 | HR 98 | Ht 64.5 in | Wt 249.5 lb

## 2017-10-22 DIAGNOSIS — Z113 Encounter for screening for infections with a predominantly sexual mode of transmission: Secondary | ICD-10-CM | POA: Insufficient documentation

## 2017-10-22 DIAGNOSIS — Z3009 Encounter for other general counseling and advice on contraception: Secondary | ICD-10-CM | POA: Diagnosis not present

## 2017-10-22 NOTE — Progress Notes (Signed)
  Subjective:     Patient ID: Molly Zimmerman, female   DOB: Nov 07, 1995, 22 y.o.   MRN: [id number]  HPI Calayah is a 22 year old white female in requesting STD testing, and is thinking about getting nexplanon.   Review of Systems Patient denies any discharge just wants STD testing, had sex at party, not sure if condom used Reviewed past medical,surgical, social and family history. Reviewed medications and allergies.     Objective:   Physical Exam BP 128/66 (BP Location: Left Arm, Patient Position: Sitting, Cuff Size: Normal)   Pulse 98   Ht 5' 4.5" (1.638 m)   Wt 249 lb 8 oz (113.2 kg)   LMP 10/03/2017 (Within Days)   BMI 42.17 kg/m  Skin warm and dry.Pelvic: external genitalia is normal in appearance no lesions, vagina: white discharge without odor,urethra has no lesions or masses noted, cervix:smooth and bulbous, uterus: normal size, shape and contour, non tender, no masses felt, adnexa: no masses or tenderness noted. Bladder is non tender and no masses felt.  GC/CHL obtained. She declines HIV and RPR, says she has had recently.Discussed pros and cons of nexplanon and she wants to get it.     Assessment:     1. Screening examination for STD (sexually transmitted disease)   2. Contraceptive education       Plan:     GC/CHL sent Order nexplanon No sex Return in 3 weeks for stat QHCG in am and nexplanon insertion in pm Review handout on nexplanon

## 2017-10-22 NOTE — Patient Instructions (Signed)
No sex Return in 3 weeks in am for stat [facility] and nexplanon in pm

## 2017-10-25 LAB — GC/CHLAMYDIA PROBE AMP
Chlamydia trachomatis, NAA: NEGATIVE
Neisseria gonorrhoeae by PCR: NEGATIVE

## 2017-11-11 ENCOUNTER — Ambulatory Visit (INDEPENDENT_AMBULATORY_CARE_PROVIDER_SITE_OTHER): Payer: Medicaid Other | Admitting: Adult Health

## 2017-11-11 ENCOUNTER — Other Ambulatory Visit: Payer: Medicaid Other

## 2017-11-11 ENCOUNTER — Encounter: Payer: Self-pay | Admitting: Adult Health

## 2017-11-11 VITALS — BP 130/70 | HR 96 | Ht 65.0 in | Wt 252.0 lb

## 2017-11-11 DIAGNOSIS — Z3049 Encounter for surveillance of other contraceptives: Secondary | ICD-10-CM

## 2017-11-11 DIAGNOSIS — Z30017 Encounter for initial prescription of implantable subdermal contraceptive: Secondary | ICD-10-CM

## 2017-11-11 DIAGNOSIS — L2389 Allergic contact dermatitis due to other agents: Secondary | ICD-10-CM | POA: Diagnosis not present

## 2017-11-11 DIAGNOSIS — Z3202 Encounter for pregnancy test, result negative: Secondary | ICD-10-CM | POA: Diagnosis not present

## 2017-11-11 LAB — BETA HCG QUANT (REF LAB): hCG Quant: <1 m[IU]/mL

## 2017-11-11 MED ORDER — PREDNISONE 10 MG (21) PO TBPK: ORAL_TABLET | ORAL | 0 refills | Status: DC

## 2017-11-11 MED ORDER — ETONOGESTREL 68 MG ~~LOC~~ IMPL
68.0000 mg | DRUG_IMPLANT | Freq: Once | SUBCUTANEOUS | Status: AC
Start: 2017-11-11 — End: 2017-11-11
  Administered 2017-11-11: 68 mg via SUBCUTANEOUS

## 2017-11-11 NOTE — Progress Notes (Addendum)
  Subjective:     Patient ID: Molly Zimmerman, female   DOB: 1996-04-19, 22 y.o.   MRN: [id number]  HPI Molly Zimmerman is a 22 year old white female in for nexplanon insertion and complains of itching from bug bites and poison ivy.   Review of Systems For nexplanon insertion ?poison ivy and bug bites  Reviewed past medical,surgical, social and family history. Reviewed medications and allergies.     Objective:   Physical Exam BP 130/70 (BP Location: Right Arm, Patient Position: Sitting, Cuff Size: Large)   Pulse 96   Ht  (1.651 m)   Wt 252 lb (114.3 kg)   LMP 11/01/2017   BMI 41.93 kg/m QHCG <1 this am. Consent signed, time out called. Left arm cleansed with betadine, and injected with 1.5 cc 1% lidocaine and waited til numb. Nexplanon easily inserted and steri strips applied.Rod easily palpated by provider and pt. Pressure dressing applied.   Has ?poison ivy right arm and on right hip and right shoulder blade, redness and swelling, no vesicles.Has whelps where scratched.   Assessment:    Nexplanon insertion, lot [id number] exp 7/21 Allergic contact dermatitis     Plan:     Use condoms x 2 weeks, keep clean and dry x 24 hours, no heavy lifting, keep steri strips on x 72 hours, Keep pressure dressing on x 24 hours. Follow up prn problems. Take benadryl prn and do not scratch  Meds ordered this encounter  Medications  . predniSONE (STERAPRED UNI-PAK 21 TAB) 10 MG (21) TBPK tablet    Sig: Take 5 the first day and taper down    Dispense:  21 tablet    Refill:  0    Order Specific Question:   Supervising Provider    Answer:   Lazaro Arms [[id number]]  it is actually 6 the first day and taper down

## 2017-11-11 NOTE — Patient Instructions (Signed)
Use condoms x 2 weeks, keep clean and dry x 24 hours, no heavy lifting, keep steri strips on x 72 hours, Keep pressure dressing on x 24 hours. Follow up prn problems.  

## 2018-01-06 ENCOUNTER — Telehealth: Payer: Self-pay | Admitting: *Deleted

## 2018-01-06 NOTE — Telephone Encounter (Signed)
Patient states she is having brown spotting since having the Nexplanon placed and wants to make sure it is normal.  No other issues.  Informed patient that bleeding can be darker and irregular.  Verbalized understanding.

## 2018-04-15 ENCOUNTER — Ambulatory Visit (INDEPENDENT_AMBULATORY_CARE_PROVIDER_SITE_OTHER): Payer: Medicaid Other | Admitting: Advanced Practice Midwife

## 2018-04-15 ENCOUNTER — Encounter: Payer: Self-pay | Admitting: Advanced Practice Midwife

## 2018-04-15 VITALS — BP 121/66 | HR 82 | Ht 64.0 in | Wt 264.0 lb

## 2018-04-15 DIAGNOSIS — Z975 Presence of (intrauterine) contraceptive device: Secondary | ICD-10-CM

## 2018-04-15 DIAGNOSIS — N921 Excessive and frequent menstruation with irregular cycle: Secondary | ICD-10-CM

## 2018-04-15 DIAGNOSIS — Z113 Encounter for screening for infections with a predominantly sexual mode of transmission: Secondary | ICD-10-CM

## 2018-04-15 NOTE — Progress Notes (Signed)
[facility]  Patient name: Molly Zimmerman MRN [id number]  Date of birth: 06-May-1996  CC & HPI:  Molly Zimmerman is a 22 y.o. Caucasian female presenting today for dark blood at times and routine STD testing.  Has Nexplanon, has monthly spotting/bleeding that is quite dark, concerned about color. Wants STD testing "to be safe"   Pertinent History Reviewed:  Medical & Surgical Hx:   Past Medical History:  Diagnosis Date  . Anxiety   . Bipolar 1 disorder (HCC)   . Endometriosis   . History of suicide attempt   . Mood disorder (HCC)   . Obesity   . ODD (oppositional defiant disorder)   . Polysubstance abuse [facility])    Past Surgical History:  Procedure Laterality Date  . ADENOIDECTOMY  Age 48  . TONSILLECTOMY AND ADENOIDECTOMY  Age5   Family History  Problem Relation Age of Onset  . ADD / ADHD Father   . Drug abuse Father   . Diabetes Maternal Grandfather   . Heart disease Paternal Grandfather   . Cancer Paternal Grandfather        MGM sister  . Drug abuse Paternal Grandmother     Current Outpatient Medications:  .  etonogestrel (NEXPLANON) 68 MG IMPL implant, 1 each by Subdermal route once., Disp: , Rfl:  .  risperiDONE (RISPERDAL) 0.25 MG tablet, Take 0.25 mg by mouth at bedtime., Disp: , Rfl:  .  risperiDONE (RISPERDAL) 0.5 MG tablet, Take 0.5 mg by mouth 2 (two) times daily., Disp: , Rfl:  .  predniSONE (STERAPRED UNI-PAK 21 TAB) 10 MG (21) TBPK tablet, Take 5 the first day and taper down (Patient not taking: Reported on 04/15/2018), Disp: 21 tablet, Rfl: 0 Social History: Reviewed -  reports that she quit smoking about 7 months ago. Her smoking use included cigarettes. She smoked 0.25 packs per day. She has never used smokeless tobacco.  Review of Systems:   Constitutional: Negative for fever and chills Eyes: Negative for visual disturbances Respiratory: Negative for shortness of breath, dyspnea Cardiovascular: Negative for chest pain or palpitations   Gastrointestinal: Negative for vomiting, diarrhea and constipation; no abdominal pain Genitourinary: Negative for dysuria and urgency, vaginal irritation or itching Musculoskeletal: Negative for back pain, joint pain, myalgias  Neurological: Negative for dizziness and headaches    Objective Findings:    Physical Examination: Vitals:   04/15/18 1431  BP: 121/66  Pulse: 82   General appearance - well appearing, and in no distress Mental status - alert, oriented to person, place, and time Chest:  Normal respiratory effort Heart - normal rate and regular rhythm Abdomen:  Soft, nontender Pelvic: scant amount of dark red blood Musculoskeletal:  Normal range of motion without pain Extremities:  No edema    No results found for this or any previous visit (from the past 24 hour(s)).    Assessment & Plan:  A:   BTB on Nexplanon, minimal  STD testing P:   Orders Placed This Encounter  Procedures  . Trichomonas vaginalis, RNA  . GC/Chlamydia Probe Amp  . HIV Antibody (routine testing w rflx)  . RPR      No follow-ups on file.  Scarlette Calico Cresenzo-Dishmon CNM 04/15/2018 3:24 PM

## 2018-04-16 LAB — RPR: RPR Ser Ql: NONREACTIVE

## 2018-04-16 LAB — HIV ANTIBODY (ROUTINE TESTING W REFLEX): HIV Screen 4th Generation wRfx: NONREACTIVE

## 2018-04-17 LAB — TRICHOMONAS VAGINALIS, PROBE AMP: Trich vag by NAA: NEGATIVE

## 2018-04-17 LAB — GC/CHLAMYDIA PROBE AMP
Chlamydia trachomatis, NAA: NEGATIVE
Neisseria gonorrhoeae by PCR: POSITIVE — AB

## 2018-04-18 ENCOUNTER — Other Ambulatory Visit: Payer: Self-pay | Admitting: Advanced Practice Midwife

## 2018-04-18 ENCOUNTER — Telehealth: Payer: Self-pay | Admitting: *Deleted

## 2018-04-18 MED ORDER — AZITHROMYCIN 500 MG PO TABS: 1000.0000 mg | ORAL_TABLET | Freq: Once | ORAL | 0 refills | Status: AC

## 2018-04-18 NOTE — Progress Notes (Signed)
Azithromycin 1gm for + GC.  Plan Im rocephin

## 2018-04-21 ENCOUNTER — Telehealth: Payer: Self-pay | Admitting: *Deleted

## 2018-04-21 ENCOUNTER — Encounter: Payer: Self-pay | Admitting: *Deleted

## 2018-04-21 MED ORDER — AZITHROMYCIN 500 MG PO TABS: 1000.0000 mg | ORAL_TABLET | Freq: Once | ORAL | 0 refills | Status: AC

## 2018-04-21 NOTE — Telephone Encounter (Signed)
Calling restrictions on phone. Unable to leave message. Mychart message sent.

## 2018-04-21 NOTE — Telephone Encounter (Signed)
Patient's number has calling restrictions and emergency contact number vm not set up.  Will send certified letter.

## 2018-04-22 ENCOUNTER — Telehealth: Payer: Self-pay | Admitting: Obstetrics & Gynecology

## 2018-04-22 ENCOUNTER — Ambulatory Visit (INDEPENDENT_AMBULATORY_CARE_PROVIDER_SITE_OTHER): Payer: Medicaid Other | Admitting: *Deleted

## 2018-04-22 VITALS — BP 121/71 | HR 67

## 2018-04-22 DIAGNOSIS — Z202 Contact with and (suspected) exposure to infections with a predominantly sexual mode of transmission: Secondary | ICD-10-CM

## 2018-04-22 MED ORDER — CEFTRIAXONE SODIUM 250 MG IJ SOLR
250.0000 mg | Freq: Once | INTRAMUSCULAR | Status: AC
  Administered 2018-04-22: 250 mg via INTRAMUSCULAR

## 2018-04-22 NOTE — Progress Notes (Signed)
Pt given rocephin 250 mg IM in left upper outer gluteal. Pt also brought azithromycin 1000 mg and took in my presence.

## 2018-04-22 NOTE — Telephone Encounter (Signed)
Patient informed she has tested positive for gonorrhea.  Will need injection of Rocephin in our office and needs to bring Azithromycin to our office when she comes to take at the same time.  Partner also needs to be treated at HD or PCP.  Pt verbalized understanding and will come in this afternoon.

## 2018-05-20 ENCOUNTER — Encounter: Payer: Self-pay | Admitting: *Deleted

## 2018-05-20 ENCOUNTER — Ambulatory Visit: Payer: Medicaid Other | Admitting: Adult Health

## 2018-05-27 ENCOUNTER — Ambulatory Visit: Payer: Medicaid Other | Admitting: Adult Health

## 2018-06-12 ENCOUNTER — Ambulatory Visit (INDEPENDENT_AMBULATORY_CARE_PROVIDER_SITE_OTHER): Payer: Medicaid Other | Admitting: Adult Health

## 2018-06-12 ENCOUNTER — Encounter: Payer: Self-pay | Admitting: Adult Health

## 2018-06-12 VITALS — BP 112/72 | HR 86 | Ht 64.0 in | Wt 263.5 lb

## 2018-06-12 DIAGNOSIS — Z3049 Encounter for surveillance of other contraceptives: Secondary | ICD-10-CM

## 2018-06-12 DIAGNOSIS — Z3202 Encounter for pregnancy test, result negative: Secondary | ICD-10-CM

## 2018-06-12 DIAGNOSIS — Z3046 Encounter for surveillance of implantable subdermal contraceptive: Secondary | ICD-10-CM

## 2018-06-12 LAB — POCT URINE PREGNANCY: Preg Test, Ur: NEGATIVE

## 2018-06-12 NOTE — Progress Notes (Signed)
Patient ID: Molly Zimmerman, female   DOB: 06/17/1996, 22 y.o.   MRN: [id number] History of Present Illness: Molly Zimmerman is a 22 year old white female in for nexplanon removal, has had irregular bleeding and weight gain.She says she will just use condoms.  Current Medications, Allergies, Past Medical History, Past Surgical History, Family History and Social History were reviewed in [facility] electronic medical record.     Review of Systems: For nexplanon removal  +irregular bleeding +weight gain    Physical Exam:BP 112/72 (BP Location: Left Arm, Patient Position: Sitting, Cuff Size: Large)   Pulse 86   Ht 5\' 4"  (1.626 m)   Wt 263 lb 8 oz (119.5 kg)   LMP 05/25/2018 (Approximate)   BMI 45.23 kg/m UPT negative. General:  Well developed, well nourished, no acute distress Skin:  Warm and dry,old scars on arms Psych:  No mood changes, alert and cooperative,seems happy Fall risk is low. Consent signed, time out called, Left arm cleansed with betadine, and injected with 2.5 cc 1% lidocaine and waited til numb.Under sterile technique a #11 blade was used to make small vertical incision, and a curved forceps was used to easily remove rod. Steri strips applied. Pressure dressing applied.  Impression:  1. Encounter for Nexplanon removal   2. Pregnancy examination or test, negative result      Plan: Use condoms, keep clean and dry x 24 hours, no heavy lifting, keep steri strips on x 72 hours, Keep pressure dressing on x 24 hours. Follow up prn problems. Pap at 23.

## 2018-06-12 NOTE — Patient Instructions (Signed)
Use condoms, keep clean and dry x 24 hours, no heavy lifting, keep steri strips on x 72 hours, Keep pressure dressing on x 24 hours. Follow up prn problems.  

## 2018-11-08 ENCOUNTER — Other Ambulatory Visit: Payer: Self-pay

## 2018-11-08 ENCOUNTER — Encounter (HOSPITAL_COMMUNITY): Payer: Self-pay | Admitting: *Deleted

## 2018-11-08 ENCOUNTER — Emergency Department (HOSPITAL_COMMUNITY)
Admission: EM | Admit: 2018-11-08 | Discharge: 2018-11-08 | Disposition: A | Discharge status: Home / Self Care | Payer: Medicaid Other | Attending: Emergency Medicine | Admitting: Emergency Medicine

## 2018-11-08 DIAGNOSIS — R319 Hematuria, unspecified: Secondary | ICD-10-CM | POA: Diagnosis present

## 2018-11-08 DIAGNOSIS — B3731 Acute candidiasis of vulva and vagina: Secondary | ICD-10-CM

## 2018-11-08 DIAGNOSIS — N3000 Acute cystitis without hematuria: Secondary | ICD-10-CM | POA: Insufficient documentation

## 2018-11-08 DIAGNOSIS — Z87891 Personal history of nicotine dependence: Secondary | ICD-10-CM | POA: Diagnosis not present

## 2018-11-08 DIAGNOSIS — A5901 Trichomonal vulvovaginitis: Secondary | ICD-10-CM | POA: Insufficient documentation

## 2018-11-08 DIAGNOSIS — Z79899 Other long term (current) drug therapy: Secondary | ICD-10-CM | POA: Diagnosis not present

## 2018-11-08 DIAGNOSIS — B373 Candidiasis of vulva and vagina: Secondary | ICD-10-CM | POA: Insufficient documentation

## 2018-11-08 LAB — URINALYSIS, ROUTINE W REFLEX MICROSCOPIC
Bacteria, UA: NONE SEEN
Bilirubin Urine: NEGATIVE
Glucose, UA: NEGATIVE mg/dL
Hgb urine dipstick: NEGATIVE
Ketones, ur: NEGATIVE mg/dL
Leukocytes,Ua: NEGATIVE
Nitrite: POSITIVE — AB
Protein, ur: NEGATIVE mg/dL
Specific Gravity, Urine: 1.025 (ref 1.005–1.030)
pH: 6 (ref 5.0–8.0)

## 2018-11-08 LAB — WET PREP, GENITAL: Clue Cells Wet Prep HPF POC: NONE SEEN

## 2018-11-08 LAB — POC URINE PREG, ED: Preg Test, Ur: NEGATIVE

## 2018-11-08 MED ORDER — STERILE WATER FOR INJECTION IJ SOLN
INTRAMUSCULAR | Status: AC
  Filled 2018-11-08: qty 10

## 2018-11-08 MED ORDER — METRONIDAZOLE 500 MG PO TABS
2000.0000 mg | ORAL_TABLET | Freq: Once | ORAL | Status: AC
  Administered 2018-11-08: 2000 mg via ORAL
  Filled 2018-11-08: qty 4

## 2018-11-08 MED ORDER — CEFTRIAXONE SODIUM 250 MG IJ SOLR
250.0000 mg | Freq: Once | INTRAMUSCULAR | Status: AC
  Administered 2018-11-08: 14:00:00 250 mg via INTRAMUSCULAR
  Filled 2018-11-08: qty 250

## 2018-11-08 MED ORDER — AZITHROMYCIN 250 MG PO TABS
1000.0000 mg | ORAL_TABLET | Freq: Once | ORAL | Status: AC
  Administered 2018-11-08: 14:00:00 1000 mg via ORAL
  Filled 2018-11-08: qty 4

## 2018-11-08 MED ORDER — CEPHALEXIN 500 MG PO CAPS: 500.0000 mg | ORAL_CAPSULE | Freq: Three times a day (TID) | ORAL | 0 refills | Status: AC

## 2018-11-08 NOTE — ED Provider Notes (Signed)
[facility] EMERGENCY DEPARTMENT Provider Note   CSN: [id number] Arrival date & time: 11/08/18  1207    History   Chief Complaint Chief Complaint  Patient presents with  . Hematuria    HPI Molly Zimmerman is a 23 y.o. female.     HPI  23 year old female presents with a 1 day history of hematuria and urinary hesitancy.  She states associated dysuria.  Symptoms started after she had a swab done yesterday for gonorrhea and [facility]lam[facility].  She states that she immediately felt pain when they did a swab and then has had a small amount of bleeding.  She notes that she had a lower abdominal cramping but denies any abdominal pain at time of evaluation.  She denies any vaginal bleeding or vaginal discharge.  She states she was told that it appeared she had a yeast infection and was given medication for this.  She denies any fevers, chills, nausea, vomiting.  Past Medical History:  Diagnosis Date  . Anxiety   . Bipolar 1 disorder (HCC)   . Endometriosis   . History of suicide attempt   . Mood disorder (HCC)   . Obesity   . ODD (oppositional defiant disorder)   . Polysubstance abuse [facility])     Patient Active Problem List   Diagnosis Date Noted  . Contraceptive education 10/22/2017  . Screening examination for STD (sexually transmitted disease) 10/22/2017  . Obesity, Class II, BMI 35-39.9, isolated (see actual BMI) 07/10/2017  . Trichomonas infection 01/16/2017  . Irregular intermenstrual bleeding 08/07/2016  . Chlamydia 07/21/2015  . Encounter for IUD removal 11/17/2014  . History of suicide attempt 12/03/2013  . Marijuana use 10/13/2013  . Polysubstance abuse (HCC) 09/26/2012  . Oppositional defiant behavior 09/24/2012  . Bipolar 1 disorder, mixed, moderate (HCC) 09/24/2012    Past Surgical History:  Procedure Laterality Date  . ADENOIDECTOMY  Age 72  . TONSILLECTOMY AND ADENOIDECTOMY  Age5     OB History    Gravida  2   Para  2   Term  2   Preterm      AB      Living  2     SAB      TAB      Ectopic      Multiple      Live Births  2            Home Medications    Prior to Admission medications   Medication Sig Start Date End Date Taking? Authorizing Provider  amoxicillin-clavulanate (AUGMENTIN) 875-125 MG tablet Take 1 tablet by mouth 2 (two) times daily.    [provider]  risperiDONE (RISPERDAL) 0.25 MG tablet Take 0.25 mg by mouth at bedtime.    [provider]  UNABLE TO FIND Antibiotic ear drop-left ear-BID    [provider]    Family History Family History  Problem Relation Age of Onset  . ADD / ADHD Father   . Drug abuse Father   . Diabetes Maternal Grandfather   . Heart disease Paternal Grandfather   . Cancer Paternal Grandfather        MGM sister  . Drug abuse Paternal Grandmother     Social History Social History   Tobacco Use  . Smoking status: Former Smoker    Packs/day: 0.25    Types: Cigarettes    Last attempt to quit: 09/06/2017    Years since quitting: 1.1  . Smokeless tobacco: Never Used  Substance  Use Topics  . Alcohol use: No    Frequency: Never    Comment: once weekly   . Drug use: No    Types: Marijuana    Comment: last used 06/27/17     Allergies   Patient has no known allergies.   Review of Systems Review of Systems  Constitutional: Negative for chills and fever.  Respiratory: Negative for shortness of breath.   Cardiovascular: Negative for chest pain.  Gastrointestinal: Positive for abdominal pain (cramping). Negative for nausea and vomiting.  Genitourinary: Positive for dysuria and hematuria. Negative for vaginal bleeding and vaginal discharge.     Physical Exam Updated Vital Signs BP 125/78 (BP Location: Left Arm)   Pulse (!) 102   Temp 97.7 F (36.5 C) (Oral)   Resp 18   Ht 5\' 4"  (1.626 m)   Wt 117.9 kg   LMP 10/26/2018   SpO2 100%   BMI 44.63 kg/m   Physical Exam Vitals signs and nursing note reviewed. Exam  conducted with a chaperone present.  Constitutional:      Appearance: She is well-developed.  HENT:     Head: Normocephalic and atraumatic.  Eyes:     Conjunctiva/sclera: Conjunctivae normal.  Neck:     Musculoskeletal: Neck supple.  Cardiovascular:     Rate and Rhythm: Normal rate and regular rhythm.     Heart sounds: Normal heart sounds. No murmur.  Pulmonary:     Effort: Pulmonary effort is normal. No respiratory distress.     Breath sounds: Normal breath sounds. No wheezing or rales.  Abdominal:     General: Bowel sounds are normal. There is no distension.     Palpations: Abdomen is soft.     Tenderness: There is no abdominal tenderness.     Comments: Soft and nontender palpation including deep palpation  Genitourinary:    Pubic Area: No rash.      Comments: Laterally female erythematous with white curd-like discharge noted, consistent with vaginal Candida.  No evidence of trauma to the urethra, no tears, no tenderness.  There is no bleeding around the urethra, no vaginal bleeding. Musculoskeletal: Normal range of motion.        General: No tenderness or deformity.  Skin:    General: Skin is warm and dry.     Findings: No erythema or rash.  Neurological:     Mental Status: She is alert and oriented to person, place, and time.  Psychiatric:        Behavior: Behavior normal.      ED Treatments / Results  Labs (all labs ordered are listed, but only abnormal results are displayed) Labs Reviewed  WET PREP, GENITAL  URINALYSIS, ROUTINE W REFLEX MICROSCOPIC  POC URINE PREG, ED    EKG None  Radiology No results found.  Procedures Procedures (including critical care time)  Medications Ordered in ED Medications - No data to display   Initial Impression / Assessment and Plan / ED Course  I have reviewed the triage vital signs and the nursing notes.  Pertinent labs & imaging results that were available during my care of the patient were reviewed by me and considered  in my medical decision making (see chart for details).        Patient presents with complaints of hematuria and urinary hesitancy.  She went for STD testing yesterday and believes they put the swab in her urethra.  On physical exam she does have erythematous labia and discharge concerning for vaginal Candida.  Patient  states she has been treated for this with a dose of fluconazole yesterday.  Her urine shows nitrates, given symptomatic, will send for culture and start on prophylactic antibiotics.  No blood in the urine.  No blood on physical exam.  Her vaginal swab shows trichomonas, yeast and white blood cells.  Offered treatment for trichomonas, gonorrhea and chlamydia and patient is agreeable with plan.  Will treat in the ED.  Contractions to get partner treated and refrain from intercourse for 2 weeks.  Patient agreeable.  She is ready and stable for discharge.  Final Clinical Impressions(s) / ED Diagnoses   Final diagnoses:  None    ED Discharge Orders    None       Rueben Bash 11/08/18 1703    Donnetta Hutching, MD 11/09/18 [phone]

## 2018-11-08 NOTE — ED Triage Notes (Signed)
Pt seen at [facility] dept yesterday for STD check, pt states a swab placed in her urethra and now has blood in her urine and difficult to urinate.

## 2018-11-08 NOTE — Discharge Instructions (Signed)
Take Keflex as prescribed.  Follow-up with your primary care provider in 2 days for continued evaluation.  Return to the ED immediately for any worsening symptoms or concerns, such as abdominal pain, fevers, chills, vomiting, or any concerns at all.

## 2018-11-10 LAB — URINE CULTURE: Culture: <10000 — AB

## 2018-12-28 ENCOUNTER — Emergency Department (HOSPITAL_COMMUNITY)
Admission: EM | Admit: 2018-12-28 | Discharge: 2018-12-29 | Disposition: A | Discharge status: Home / Self Care | Payer: Medicaid Other | Attending: Emergency Medicine | Admitting: Emergency Medicine

## 2018-12-28 ENCOUNTER — Encounter (HOSPITAL_COMMUNITY): Payer: Self-pay | Admitting: Emergency Medicine

## 2018-12-28 ENCOUNTER — Emergency Department (HOSPITAL_COMMUNITY): Payer: Medicaid Other

## 2018-12-28 ENCOUNTER — Other Ambulatory Visit: Payer: Self-pay

## 2018-12-28 DIAGNOSIS — Z3A01 Less than 8 weeks gestation of pregnancy: Secondary | ICD-10-CM | POA: Insufficient documentation

## 2018-12-28 DIAGNOSIS — O9989 Other specified diseases and conditions complicating pregnancy, childbirth and the puerperium: Secondary | ICD-10-CM | POA: Diagnosis not present

## 2018-12-28 DIAGNOSIS — O99331 Smoking (tobacco) complicating pregnancy, first trimester: Secondary | ICD-10-CM | POA: Diagnosis not present

## 2018-12-28 DIAGNOSIS — R102 Pelvic and perineal pain: Secondary | ICD-10-CM | POA: Diagnosis not present

## 2018-12-28 DIAGNOSIS — Z79899 Other long term (current) drug therapy: Secondary | ICD-10-CM | POA: Insufficient documentation

## 2018-12-28 DIAGNOSIS — O3680X0 Pregnancy with inconclusive fetal viability, not applicable or unspecified: Secondary | ICD-10-CM

## 2018-12-28 DIAGNOSIS — F17210 Nicotine dependence, cigarettes, uncomplicated: Secondary | ICD-10-CM | POA: Diagnosis not present

## 2018-12-28 LAB — URINALYSIS, ROUTINE W REFLEX MICROSCOPIC
Bilirubin Urine: NEGATIVE
Glucose, UA: NEGATIVE mg/dL
Ketones, ur: NEGATIVE mg/dL
Nitrite: NEGATIVE
Protein, ur: NEGATIVE mg/dL
Specific Gravity, Urine: 1.011 (ref 1.005–1.030)
pH: 6 (ref 5.0–8.0)

## 2018-12-28 LAB — PREGNANCY, URINE: Preg Test, Ur: POSITIVE — AB

## 2018-12-28 NOTE — ED Triage Notes (Signed)
Pt c/o RT lower abdominal cramping x 2-3 days. Denies vaginal bleeding. States LMP was 11/26/2018, and she took a home pregnancy test that had inconclusive results. Denies n/v/d or urinary symptoms.

## 2018-12-29 ENCOUNTER — Telehealth: Payer: Self-pay | Admitting: *Deleted

## 2018-12-29 LAB — WET PREP, GENITAL
Clue Cells Wet Prep HPF POC: NONE SEEN
Sperm: NONE SEEN
Trich, Wet Prep: NONE SEEN
Yeast Wet Prep HPF POC: NONE SEEN

## 2018-12-29 LAB — HCG, QUANTITATIVE, PREGNANCY: hCG, Beta Chain, Quant, S: 1327 m[IU]/mL — ABNORMAL HIGH (ref <5)

## 2018-12-29 MED ORDER — PRENATAL VITAMIN 27-0.8 MG PO TABS: 1.0000 | ORAL_TABLET | Freq: Every day | ORAL | 1 refills | Status: AC

## 2018-12-29 NOTE — Telephone Encounter (Signed)
Patient called back wanting to know if the nurse can call her back today, if she needs labs repeated she can do it Wednesday. Please call patient

## 2018-12-29 NOTE — Telephone Encounter (Signed)
Patient called stating she took a home test it was positive and the [facility] told her she was pregnant and she will need more lab work done. Please advise

## 2018-12-29 NOTE — ED Provider Notes (Signed)
[facility] EMERGENCY DEPARTMENT Provider Note   CSN: [id number] Arrival date & time: 12/28/18  1807    History   Chief Complaint Chief Complaint  Patient presents with   Abdominal Cramping    HPI Molly Zimmerman is a 23 y.o. female G2P2, with past medical history as outlined below, presenting with suprapubic and right lower mild cramping pain, took a home pregnancy test with slightly positive results.  She denies n/v, fevers, dysuria, also no vaginal dc or bleeding, no breast tenderness or dizziness.  She was treated for trichomonas one month ago, however not sure if her partner was treated.  Her LMP was 11/26/18 , 5 days duration and normal.      The history is provided by the patient.    Past Medical History:  Diagnosis Date   Anxiety    Bipolar 1 disorder (HCC)    Endometriosis    History of suicide attempt    Mood disorder (HCC)    Obesity    ODD (oppositional defiant disorder)    Polysubstance abuse [facility](HCC)     Patient Active Problem List   Diagnosis Date Noted   Contraceptive education 10/22/2017   Screening examination for STD (sexually transmitted disease) 10/22/2017   Obesity, Class II, BMI 35-39.9, isolated (see actual BMI) 07/10/2017   Trichomonas infection 01/16/2017   Irregular intermenstrual bleeding 08/07/2016   Chlamydia 07/21/2015   Encounter for IUD removal 11/17/2014   History of suicide attempt 12/03/2013   Marijuana use 10/13/2013   Polysubstance abuse (HCC) 09/26/2012   Oppositional defiant behavior 09/24/2012   Bipolar 1 disorder, mixed, moderate (HCC) 09/24/2012    Past Surgical History:  Procedure Laterality Date   ADENOIDECTOMY  Age 23   TONSILLECTOMY AND ADENOIDECTOMY  Age5     OB History    Gravida  3   Para  2   Term  2   Preterm      AB      Living  2     SAB      TAB      Ectopic      Multiple      Live Births  2            Home Medications    Prior to Admission medications     Medication Sig Start Date End Date Taking? Authorizing Provider  LATUDA 20 MG TABS tablet Take 20 mg by mouth every morning.  12/18/18  Yes [provider]  LATUDA 40 MG TABS tablet Take 40 mg by mouth at bedtime.  10/23/18  Yes [provider]  Prenatal Vit-Fe Fumarate-FA (PRENATAL VITAMIN) 27-0.8 MG TABS Take 1 tablet by mouth daily. 12/29/18 01/28/19  Burgess AmorIdol, Maris Bena, PA-C    Family History Family History  Problem Relation Age of Onset   ADD / ADHD Father    Drug abuse Father    Diabetes Maternal Grandfather    Heart disease Paternal Grandfather    Cancer Paternal Grandfather        MGM sister   Drug abuse Paternal Grandmother     Social History Social History   Tobacco Use   Smoking status: Current Some Day Smoker    Packs/day: 0.25    Types: Cigarettes   Smokeless tobacco: Never Used  Substance Use Topics   Alcohol use: No    Frequency: Never    Comment: socially    Drug use: No    Types: Marijuana    Comment: last used 06/27/17  Allergies   Patient has no known allergies.   Review of Systems Review of Systems  Constitutional: Negative for fever.  HENT: Negative for congestion and sore throat.   Eyes: Negative.   Respiratory: Negative for chest tightness and shortness of breath.   Cardiovascular: Negative for chest pain.  Gastrointestinal: Negative for abdominal pain, nausea and vomiting.  Genitourinary: Positive for pelvic pain. Negative for vaginal bleeding and vaginal discharge.  Musculoskeletal: Negative for arthralgias, joint swelling and neck pain.  Skin: Negative.  Negative for rash and wound.  Neurological: Negative for dizziness, weakness, light-headedness, numbness and headaches.  Psychiatric/Behavioral: Negative.      Physical Exam Updated Vital Signs BP 120/78    Pulse 72    Temp 98.4 F (36.9 C) (Oral)    Resp 17    Ht 5\' 4"  (1.626 m)    Wt 121.6 kg    LMP 11/26/2018 (Exact Date)    SpO2 98%    BMI 46.00 kg/m    Physical Exam Vitals signs and nursing note reviewed. Exam conducted with a chaperone present.  Constitutional:      Appearance: She is well-developed.  HENT:     Head: Normocephalic and atraumatic.  Eyes:     Conjunctiva/sclera: Conjunctivae normal.  Neck:     Musculoskeletal: Normal range of motion.  Cardiovascular:     Rate and Rhythm: Normal rate and regular rhythm.     Heart sounds: Normal heart sounds.  Pulmonary:     Effort: Pulmonary effort is normal.     Breath sounds: Normal breath sounds. No wheezing.  Abdominal:     General: Bowel sounds are normal.     Palpations: Abdomen is soft.     Tenderness: There is no abdominal tenderness.  Genitourinary:    Vagina: Vaginal discharge present.     Cervix: Normal.     Uterus: Normal.      Adnexa: Left adnexa normal.       Right: Tenderness present. No mass or fullness.       Comments: Small amount white thin vaginal dc. Mild ttp right adnexa. Musculoskeletal: Normal range of motion.  Skin:    General: Skin is warm and dry.  Neurological:     Mental Status: She is alert.      ED Treatments / Results  Labs (all labs ordered are listed, but only abnormal results are displayed) Labs Reviewed  WET PREP, GENITAL - Abnormal; Notable for the following components:      Result Value   WBC, Wet Prep HPF POC RARE (*)    All other components within normal limits  URINALYSIS, ROUTINE W REFLEX MICROSCOPIC - Abnormal; Notable for the following components:   APPearance HAZY (*)    Hgb urine dipstick SMALL (*)    Leukocytes,Ua TRACE (*)    Bacteria, UA RARE (*)    All other components within normal limits  PREGNANCY, URINE - Abnormal; Notable for the following components:   Preg Test, Ur POSITIVE (*)    All other components within normal limits  HCG, QUANTITATIVE, PREGNANCY - Abnormal; Notable for the following components:   hCG, Beta Chain, Quant, S 1,327 (*)    All other components within normal limits  GC/CHLAMYDIA PROBE  AMP ([place] NOT [facility]    EKG None  Radiology [place] Ob Comp < 14 Wks  Result Date: 12/29/2018 CLINICAL DATA:  Pelvic cramping positive urine pregnancy test EXAM: OBSTETRIC <14 WK US AND TRANSVAGINAL OB US TECHNIQUE: Both transabdominal and transvaginal  ultrasound examinations were performed for complete evaluation of the gestation as well as the maternal uterus, adnexal regions, and pelvic cul-de-sac. Transvaginal technique was performed to assess early pregnancy. COMPARISON:  None. FINDINGS: Intrauterine gestational sac: None Yolk sac:  Not Visualized. Embryo:  Not Visualized. Maternal uterus/adnexae: Ovaries are within normal limits. The right ovary measures 3.4 x 1.8 x 2.3 cm with a volume of 7.6 mL. The left ovary is visualized trans abdominally and measures 3.4 x 1.8 x 1.3 cm with a volume of 4.1 mL. There is thickening of the endometrial stripe up to 15 mm with small amount of endometrial fluid at the fundus but no discrete gestational sac. IMPRESSION: No definitive IUP identified. Findings are consistent with pregnancy of unknown location, differential of which includes IUP too early to visualize, recent failed pregnancy, and occult ectopic pregnancy. Recommend trending of HCG with repeat ultrasound as indicated Electronically Signed   By: Donavan Foil M.D.   On: 12/29/2018 00:12   US Ob Transvaginal  Result Date: 12/29/2018 CLINICAL DATA:  Pelvic cramping positive urine pregnancy test EXAM: OBSTETRIC <14 WK [place] AND TRANSVAGINAL OB US TECHNIQUE: Both transabdominal and transvaginal ultrasound examinations were performed for complete evaluation of the gestation as well as the maternal uterus, adnexal regions, and pelvic cul-de-sac. Transvaginal technique was performed to assess early pregnancy. COMPARISON:  None. FINDINGS: Intrauterine gestational sac: None Yolk sac:  Not Visualized. Embryo:  Not Visualized. Maternal uterus/adnexae: Ovaries are within normal limits. The right ovary measures 3.4  x 1.8 x 2.3 cm with a volume of 7.6 mL. The left ovary is visualized trans abdominally and measures 3.4 x 1.8 x 1.3 cm with a volume of 4.1 mL. There is thickening of the endometrial stripe up to 15 mm with small amount of endometrial fluid at the fundus but no discrete gestational sac. IMPRESSION: No definitive IUP identified. Findings are consistent with pregnancy of unknown location, differential of which includes IUP too early to visualize, recent failed pregnancy, and occult ectopic pregnancy. Recommend trending of HCG with repeat ultrasound as indicated Electronically Signed   By: Donavan Foil M.D.   On: 12/29/2018 00:12    Procedures Procedures (including critical care time)  Medications Ordered in ED Medications - No data to display   Initial Impression / Assessment and Plan / ED Course  I have reviewed the triage vital signs and the nursing notes.  Pertinent labs & imaging results that were available during my care of the patient were reviewed by me and considered in my medical decision making (see chart for details).        Pt with low pelvic discomfort, early in pregnancy, 3-4 weeks per LMP and quantitative testing.  No findings at this time on [place] with no visualized intrauterine preg, but early.  Will need repeat quant in 3 days and repeat US per gyn discretion.  Pt has been pt of [facility], encouraged to call tomorrow to arrange prenatal care and repeat quant.  Final Clinical Impressions(s) / ED Diagnoses   Final diagnoses:  Pregnancy of unknown anatomic location    ED Discharge Orders         Ordered    Prenatal Vit-Fe Fumarate-FA (PRENATAL VITAMIN) 27-0.8 MG TABS  Daily     12/29/18 0104           Evalee [place], PA-C 12/29/18 0105    Milton Ferguson, MD 12/31/18 [phone]

## 2018-12-29 NOTE — Discharge Instructions (Signed)
As discussed, your labs suggest a very early pregnancy which is not yet seen on tonights ultrasound.  You will need a repeat quantitative hcg (blood test) in 3 days to ensure this doubles, which is the sign of a normal progressing pregnancy.  Call [facility] for this and to establish prenatal care.  As discussed, your wet prep is negative for any infections tonight.  Your gonorrhea and Chlamydia tests are pending and you will be notified if either is positive.

## 2018-12-31 ENCOUNTER — Other Ambulatory Visit: Payer: Self-pay

## 2018-12-31 ENCOUNTER — Other Ambulatory Visit: Payer: Medicaid Other

## 2018-12-31 DIAGNOSIS — O3680X0 Pregnancy with inconclusive fetal viability, not applicable or unspecified: Secondary | ICD-10-CM

## 2018-12-31 LAB — GC/CHLAMYDIA PROBE AMP (~~LOC~~) NOT AT ARMC
Chlamydia: NEGATIVE
Neisseria Gonorrhea: NEGATIVE

## 2019-01-01 LAB — BETA HCG QUANT (REF LAB): hCG Quant: 2574 m[IU]/mL

## 2019-01-04 ENCOUNTER — Other Ambulatory Visit: Payer: Self-pay

## 2019-01-04 ENCOUNTER — Encounter (HOSPITAL_COMMUNITY): Payer: Self-pay | Admitting: *Deleted

## 2019-01-04 ENCOUNTER — Emergency Department (HOSPITAL_COMMUNITY)
Admission: EM | Admit: 2019-01-04 | Discharge: 2019-01-04 | Disposition: A | Discharge status: Home / Self Care | Payer: Medicaid Other | Attending: Emergency Medicine | Admitting: Emergency Medicine

## 2019-01-04 DIAGNOSIS — H60502 Unspecified acute noninfective otitis externa, left ear: Secondary | ICD-10-CM | POA: Diagnosis not present

## 2019-01-04 DIAGNOSIS — F17210 Nicotine dependence, cigarettes, uncomplicated: Secondary | ICD-10-CM | POA: Insufficient documentation

## 2019-01-04 DIAGNOSIS — H9202 Otalgia, left ear: Secondary | ICD-10-CM | POA: Diagnosis present

## 2019-01-04 DIAGNOSIS — Z79899 Other long term (current) drug therapy: Secondary | ICD-10-CM | POA: Diagnosis not present

## 2019-01-04 MED ORDER — NEOMYCIN-POLYMYXIN-HC 3.5-10000-1 OT SUSP: 4.0000 [drp] | Freq: Three times a day (TID) | OTIC | 0 refills | Status: AC

## 2019-01-04 NOTE — ED Provider Notes (Signed)
[facility] EMERGENCY DEPARTMENT Provider Note   CSN: [id number] Arrival date & time: 01/04/19  1925    History   Chief Complaint Chief Complaint  Patient presents with   Otalgia    HPI Molly Zimmerman is a 23 y.o. female.     HPI  23 year old female presents with left ear pain.  Started off as pain just anterior to her left ear a couple days ago.  No fevers, cough, sore throat.  Has tried Tylenol with no relief.  She estimates she is about 4 or [redacted] weeks pregnant and thus is not taking NSAIDs.  A few days ago she was cleaning out her ears and got a lot of earwax out with a Q-tip.  Past Medical History:  Diagnosis Date   Anxiety    Bipolar 1 disorder ([place])    Endometriosis    History of suicide attempt    Mood disorder ([place])    Obesity    ODD (oppositional defiant disorder)    Polysubstance abuse [facility])     Patient Active Problem List   Diagnosis Date Noted   Contraceptive education 10/22/2017   Screening examination for STD (sexually transmitted disease) 10/22/2017   Obesity, Class II, BMI 35-39.9, isolated (see actual BMI) 07/10/2017   Trichomonas infection 01/16/2017   Irregular intermenstrual bleeding 08/07/2016   Chlamydia 07/21/2015   Encounter for IUD removal 11/17/2014   History of suicide attempt 12/03/2013   Marijuana use 10/13/2013   Polysubstance abuse ([place]) 09/26/2012   Oppositional defiant behavior 09/24/2012   Bipolar 1 disorder, mixed, moderate ([place]) 09/24/2012    Past Surgical History:  Procedure Laterality Date   ADENOIDECTOMY  Age 25   TONSILLECTOMY AND ADENOIDECTOMY  Age5     OB History    Gravida  3   Para  2   Term  2   Preterm      AB      Living  2     SAB      TAB      Ectopic      Multiple      Live Births  2            Home Medications    Prior to Admission medications   Medication Sig Start Date End Date Taking? Authorizing Provider  LATUDA 20 MG TABS tablet Take 20 mg by mouth every  morning.  12/18/18   [provider]  LATUDA 40 MG TABS tablet Take 40 mg by mouth at bedtime.  10/23/18   [provider]  neomycin-polymyxin-hydrocortisone (CORTISPORIN) 3.5-10000-1 OTIC suspension Place 4 drops into the left ear 3 (three) times daily for 7 days. 01/04/19 01/11/19  Sherwood Gambler, MD  Prenatal Vit-Fe Fumarate-FA (PRENATAL VITAMIN) 27-0.8 MG TABS Take 1 tablet by mouth daily. 12/29/18 01/28/19  Evalee Jefferson, PA-C    Family History Family History  Problem Relation Age of Onset   ADD / ADHD Father    Drug abuse Father    Diabetes Maternal Grandfather    Heart disease Paternal Grandfather    Cancer Paternal Grandfather        MGM sister   Drug abuse Paternal Grandmother     Social History Social History   Tobacco Use   Smoking status: Current Some Day Smoker    Packs/day: 0.25    Types: Cigarettes   Smokeless tobacco: Never Used  Substance Use Topics   Alcohol use: No    Frequency: Never    Comment: socially  Drug use: No    Types: Marijuana    Comment: last used 06/27/17     Allergies   Patient has no known allergies.   Review of Systems Review of Systems  Constitutional: Negative for fever.  HENT: Positive for ear pain. Negative for sore throat.   Respiratory: Negative for cough and shortness of breath.      Physical Exam Updated Vital Signs BP (!) 111/59 (BP Location: Right Arm)    Pulse 65    Temp 98.1 F (36.7 C) (Oral)    Resp 20    Ht 5\' 4"  (1.626 m)    Wt 119.7 kg    LMP 11/26/2018 (Exact Date)    SpO2 100%    BMI 45.32 kg/m   Physical Exam Vitals signs and nursing note reviewed.  Constitutional:      General: She is not in acute distress.    Appearance: She is well-developed. She is not ill-appearing or diaphoretic.  HENT:     Head: Normocephalic and atraumatic.     Right Ear: Tympanic membrane and external ear normal.     Left Ear: Tympanic membrane and external ear normal. No swelling or tenderness. No mastoid  tenderness.     Ears:     Comments: Left canal has some mild abrasions, maybe early debris. It is tender on the posterior aspect of the canal. No significant dental abrnormality    Nose: Nose normal.  Eyes:     General:        Right eye: No discharge.        Left eye: No discharge.  Cardiovascular:     Rate and Rhythm: Normal rate and regular rhythm.     Heart sounds: Normal heart sounds.  Pulmonary:     Effort: Pulmonary effort is normal.     Breath sounds: Normal breath sounds.  Abdominal:     General: There is no distension.  Skin:    General: Skin is warm and dry.  Neurological:     Mental Status: She is alert.  Psychiatric:        Mood and Affect: Mood is not anxious.      ED Treatments / Results  Labs (all labs ordered are listed, but only abnormal results are displayed) Labs Reviewed - No data to display  EKG None  Radiology No results found.  Procedures Procedures (including critical care time)  Medications Ordered in ED Medications - No data to display   Initial Impression / Assessment and Plan / ED Course  I have reviewed the triage vital signs and the nursing notes.  Pertinent labs & imaging results that were available during my care of the patient were reviewed by me and considered in my medical decision making (see chart for details).        Patient appears to have some mild early otitis externa, likely from direct trauma from trying to clean ears.  She is pregnant.  Will give Cortisporin and have her follow-up with PCP.  No mastoid tenderness or otitis media.  No dental infection or pain.  Final Clinical Impressions(s) / ED Diagnoses   Final diagnoses:  Acute otitis externa of left ear, unspecified type    ED Discharge Orders         Ordered    neomycin-polymyxin-hydrocortisone (CORTISPORIN) 3.5-10000-1 OTIC suspension  3 times daily     01/04/19 2009           Pricilla LovelessGoldston, Keon Benscoter, MD 01/04/19 2041

## 2019-01-04 NOTE — ED Triage Notes (Signed)
Pt with left ear ache for past 24 hours. Hx of ear aches as well. Denies fever.

## 2019-01-05 ENCOUNTER — Emergency Department (HOSPITAL_COMMUNITY)
Admission: EM | Admit: 2019-01-05 | Discharge: 2019-01-05 | Disposition: A | Discharge status: Home / Self Care | Payer: Medicaid Other | Attending: Emergency Medicine | Admitting: Emergency Medicine

## 2019-01-05 ENCOUNTER — Other Ambulatory Visit: Payer: Self-pay

## 2019-01-05 ENCOUNTER — Encounter (HOSPITAL_COMMUNITY): Payer: Self-pay | Admitting: Emergency Medicine

## 2019-01-05 DIAGNOSIS — Z79899 Other long term (current) drug therapy: Secondary | ICD-10-CM | POA: Diagnosis not present

## 2019-01-05 DIAGNOSIS — Z331 Pregnant state, incidental: Secondary | ICD-10-CM | POA: Diagnosis not present

## 2019-01-05 DIAGNOSIS — H9202 Otalgia, left ear: Secondary | ICD-10-CM | POA: Diagnosis not present

## 2019-01-05 DIAGNOSIS — F17210 Nicotine dependence, cigarettes, uncomplicated: Secondary | ICD-10-CM | POA: Insufficient documentation

## 2019-01-05 NOTE — ED Notes (Signed)
Pt ambulatory to waiting room. Pt verbalized understanding of discharge instructions.   

## 2019-01-05 NOTE — ED Triage Notes (Signed)
Pt states that she was here yesterday for the same thing but her ear is hurting her worse she is [redacted] weeks pregnant also

## 2019-01-05 NOTE — ED Notes (Signed)
EDP at bedside  

## 2019-01-05 NOTE — ED Provider Notes (Signed)
[facility] EMERGENCY DEPARTMENT Provider Note   CSN: [id number] Arrival date & time: 01/05/19  1814     History   Chief Complaint Chief Complaint  Patient presents with  . Otalgia    HPI Molly Zimmerman is a 23 y.o. female.     HPI   Molly Zimmerman is a 23 y.o. female G3P2 who is [redacted] weeks pregnant by LMP, who presents to the Emergency Department complaining of persistent left ear pain.  She was seen here yesterday for same and treated with Cortisporin drops.  She returns today due to worsening pain to her ear that she describes as sharp and piercing type pain unrelieved with Tylenol.  She admits to cleaning her ears with Q-tips a few days ago.  She denies cough, fever, dental pain, sore throat, hearing loss or drainage from the ear.    Past Medical History:  Diagnosis Date  . Anxiety   . Bipolar 1 disorder (HCC)   . Endometriosis   . History of suicide attempt   . Mood disorder (HCC)   . Obesity   . ODD (oppositional defiant disorder)   . Polysubstance abuse [facility](HCC)     Patient Active Problem List   Diagnosis Date Noted  . Contraceptive education 10/22/2017  . Screening examination for STD (sexually transmitted disease) 10/22/2017  . Obesity, Class II, BMI 35-39.9, isolated (see actual BMI) 07/10/2017  . Trichomonas infection 01/16/2017  . Irregular intermenstrual bleeding 08/07/2016  . Chlamydia 07/21/2015  . Encounter for IUD removal 11/17/2014  . History of suicide attempt 12/03/2013  . Marijuana use 10/13/2013  . Polysubstance abuse (HCC) 09/26/2012  . Oppositional defiant behavior 09/24/2012  . Bipolar 1 disorder, mixed, moderate (HCC) 09/24/2012    Past Surgical History:  Procedure Laterality Date  . ADENOIDECTOMY  Age 23  . TONSILLECTOMY AND ADENOIDECTOMY  Age5     OB History    Gravida  3   Para  2   Term  2   Preterm      AB      Living  2     SAB      TAB      Ectopic      Multiple      Live Births  2            Home Medications     Prior to Admission medications   Medication Sig Start Date End Date Taking? Authorizing Provider  LATUDA 20 MG TABS tablet Take 20 mg by mouth every morning.  12/18/18   [provider]  LATUDA 40 MG TABS tablet Take 40 mg by mouth at bedtime.  10/23/18   [provider]  neomycin-polymyxin-hydrocortisone (CORTISPORIN) 3.5-10000-1 OTIC suspension Place 4 drops into the left ear 3 (three) times daily for 7 days. 01/04/19 01/11/19  Pricilla LovelessGoldston, Scott, MD  Prenatal Vit-Fe Fumarate-FA (PRENATAL VITAMIN) 27-0.8 MG TABS Take 1 tablet by mouth daily. 12/29/18 01/28/19  Burgess AmorIdol, Julie, PA-C    Family History Family History  Problem Relation Age of Onset  . ADD / ADHD Father   . Drug abuse Father   . Diabetes Maternal Grandfather   . Heart disease Paternal Grandfather   . Cancer Paternal Grandfather        MGM sister  . Drug abuse Paternal Grandmother     Social History Social History   Tobacco Use  . Smoking status: Current Some Day Smoker    Packs/day: 0.25    Types: Cigarettes  . Smokeless tobacco: Never  Used  Substance Use Topics  . Alcohol use: No    Frequency: Never    Comment: socially   . Drug use: No    Types: Marijuana    Comment: last used 06/27/17     Allergies   Patient has no known allergies.   Review of Systems Review of Systems  Constitutional: Negative for activity change, appetite change, chills and fever.  HENT: Positive for ear pain. Negative for congestion, dental problem, ear discharge, facial swelling, hearing loss, sore throat and trouble swallowing.   Respiratory: Negative for cough, chest tightness and shortness of breath.   Cardiovascular: Negative for chest pain.  Gastrointestinal: Negative for abdominal pain, nausea and vomiting.  Genitourinary: Negative for flank pain.  Musculoskeletal: Negative for arthralgias, myalgias and neck pain.  Skin: Negative for rash.  Neurological: Negative for dizziness, tremors, syncope, weakness and  numbness.     Physical Exam Updated Vital Signs BP (!) 146/92 (BP Location: Right Arm)   Pulse 86   Temp 98.4 F (36.9 C) (Oral)   Resp 14   Ht 5\' 4"  (1.626 m)   Wt 119.7 kg   LMP 11/26/2018 (Exact Date)   SpO2 100%   BMI 45.32 kg/m   Physical Exam Vitals signs and nursing note reviewed.  Constitutional:      Appearance: Normal appearance. She is not ill-appearing.  HENT:     Head: Atraumatic.     Right Ear: Tympanic membrane and ear canal normal.     Ears:     Comments: Pain with movement of the left external ear.  No significant erythema of the left TM.  No perforation or bulging.  No mastoid tenderness    Mouth/Throat:     Mouth: Mucous membranes are moist.     Dentition: No dental tenderness, gingival swelling or dental caries.     Pharynx: Oropharynx is clear.  Neck:     Musculoskeletal: Normal range of motion. No muscular tenderness.  Cardiovascular:     Rate and Rhythm: Normal rate.  Pulmonary:     Effort: Pulmonary effort is normal.     Breath sounds: Normal breath sounds.  Chest:     Chest wall: No tenderness.  Musculoskeletal: Normal range of motion.  Lymphadenopathy:     Cervical: No cervical adenopathy.  Skin:    General: Skin is warm.     Capillary Refill: Capillary refill takes less than 2 seconds.     Findings: No rash.  Neurological:     General: No focal deficit present.     Mental Status: She is alert.      ED Treatments / Results  Labs (all labs ordered are listed, but only abnormal results are displayed) Labs Reviewed - No data to display  EKG None  Radiology No results found.  Procedures Procedures (including critical care time)  Medications Ordered in ED Medications - No data to display   Initial Impression / Assessment and Plan / ED Course  I have reviewed the triage vital signs and the nursing notes.  Pertinent labs & imaging results that were available during my care of the patient were reviewed by me and considered  in my medical decision making (see chart for details).        Pt is well appearing.  Pain to the left internal ear w/o focal neuro deficits or obvious OM.  She is currently using Cortisporin otic drops w/o relief.  No pregnancy related complaints.  Will prescribe compounded equivalent for Auralgan from [place].  She plans to arrange prenatal care with [facility].    Final Clinical Impressions(s) / ED Diagnoses   Final diagnoses:  Otalgia of left ear    ED Discharge Orders    None       Bufford Lope 01/05/19 Arvin Collard, MD 01/07/19 [phone]

## 2019-01-05 NOTE — Discharge Instructions (Addendum)
Apply the drops to the affected ear as directed.  Use them sparingly.  Tylenol every 4 hrs if needed,.

## 2019-01-08 ENCOUNTER — Telehealth: Payer: Self-pay | Admitting: Obstetrics & Gynecology

## 2019-01-08 NOTE — Telephone Encounter (Signed)
Patient states someone from our officed to get her scheduled for an u/s.  Informed patient Dr Elonda Husky tried to call her to let her know to get it scheduled in 2-3 weeks.  Pt agreeable to plan and appt made.

## 2019-01-08 NOTE — Telephone Encounter (Signed)
Patient called, stated that she has had labs done for our office already and that someone here told her that she needed an ultrasound.  They patient has not been here since 05/2018, please advise.  [phone]

## 2019-01-28 ENCOUNTER — Other Ambulatory Visit: Payer: Self-pay | Admitting: Obstetrics & Gynecology

## 2019-01-28 DIAGNOSIS — O3680X0 Pregnancy with inconclusive fetal viability, not applicable or unspecified: Secondary | ICD-10-CM

## 2019-01-29 ENCOUNTER — Ambulatory Visit (INDEPENDENT_AMBULATORY_CARE_PROVIDER_SITE_OTHER): Payer: Medicaid Other

## 2019-01-29 ENCOUNTER — Other Ambulatory Visit: Payer: Self-pay

## 2019-01-29 DIAGNOSIS — Z3A09 9 weeks gestation of pregnancy: Secondary | ICD-10-CM

## 2019-01-29 DIAGNOSIS — O3680X0 Pregnancy with inconclusive fetal viability, not applicable or unspecified: Secondary | ICD-10-CM

## 2019-01-29 NOTE — Progress Notes (Signed)
[place] 9+1 wks,single IUP,positive fht 166 bpm,crl 25.59 mm,normal ovaries bilat

## 2019-02-03 ENCOUNTER — Telehealth: Payer: Self-pay | Admitting: Advanced Practice Midwife

## 2019-02-03 NOTE — Telephone Encounter (Signed)
Patient called, would like to be seen for STD screening.  [employer]  [phone]

## 2019-02-04 NOTE — Telephone Encounter (Signed)
Called patient, lmoam to call and schedule a self swab.

## 2019-02-05 ENCOUNTER — Other Ambulatory Visit: Payer: Self-pay

## 2019-02-05 ENCOUNTER — Other Ambulatory Visit: Payer: Medicaid Other

## 2019-02-05 DIAGNOSIS — Z113 Encounter for screening for infections with a predominantly sexual mode of transmission: Secondary | ICD-10-CM

## 2019-02-08 LAB — NUSWAB VAGINITIS PLUS (VG+)
Atopobium vaginae: HIGH Score — AB
BVAB 2: HIGH Score — AB
Candida albicans, NAA: NEGATIVE
Candida glabrata, NAA: NEGATIVE
Chlamydia trachomatis, NAA: NEGATIVE
Megasphaera 1: HIGH Score — AB
Neisseria gonorrhoeae, NAA: NEGATIVE
Trich vag by NAA: NEGATIVE

## 2019-02-09 ENCOUNTER — Other Ambulatory Visit: Payer: Self-pay | Admitting: Obstetrics & Gynecology

## 2019-02-09 MED ORDER — METRONIDAZOLE 0.75 % VA GEL: VAGINAL | 0 refills | Status: DC

## 2019-02-19 ENCOUNTER — Other Ambulatory Visit: Payer: Medicaid Other

## 2019-02-19 ENCOUNTER — Other Ambulatory Visit: Payer: Self-pay | Admitting: Obstetrics & Gynecology

## 2019-02-19 DIAGNOSIS — Z3682 Encounter for antenatal screening for nuchal translucency: Secondary | ICD-10-CM

## 2019-02-20 ENCOUNTER — Ambulatory Visit (INDEPENDENT_AMBULATORY_CARE_PROVIDER_SITE_OTHER): Payer: Medicaid Other | Admitting: Advanced Practice Midwife

## 2019-02-20 ENCOUNTER — Ambulatory Visit (INDEPENDENT_AMBULATORY_CARE_PROVIDER_SITE_OTHER): Payer: Medicaid Other

## 2019-02-20 ENCOUNTER — Encounter: Payer: Self-pay | Admitting: Advanced Practice Midwife

## 2019-02-20 ENCOUNTER — Other Ambulatory Visit: Payer: Self-pay

## 2019-02-20 ENCOUNTER — Ambulatory Visit: Payer: Medicaid Other | Admitting: *Deleted

## 2019-02-20 VITALS — BP 116/73 | HR 71 | Wt 274.0 lb

## 2019-02-20 DIAGNOSIS — Z3A12 12 weeks gestation of pregnancy: Secondary | ICD-10-CM

## 2019-02-20 DIAGNOSIS — Z3481 Encounter for supervision of other normal pregnancy, first trimester: Secondary | ICD-10-CM

## 2019-02-20 DIAGNOSIS — Z3490 Encounter for supervision of normal pregnancy, unspecified, unspecified trimester: Secondary | ICD-10-CM | POA: Insufficient documentation

## 2019-02-20 DIAGNOSIS — Z3682 Encounter for antenatal screening for nuchal translucency: Secondary | ICD-10-CM | POA: Diagnosis not present

## 2019-02-20 DIAGNOSIS — Z1379 Encounter for other screening for genetic and chromosomal anomalies: Secondary | ICD-10-CM

## 2019-02-20 DIAGNOSIS — Z131 Encounter for screening for diabetes mellitus: Secondary | ICD-10-CM

## 2019-02-20 DIAGNOSIS — Z1389 Encounter for screening for other disorder: Secondary | ICD-10-CM

## 2019-02-20 DIAGNOSIS — Z331 Pregnant state, incidental: Secondary | ICD-10-CM

## 2019-02-20 DIAGNOSIS — Z1371 Encounter for nonprocreative screening for genetic disease carrier status: Secondary | ICD-10-CM

## 2019-02-20 LAB — POCT URINALYSIS DIPSTICK OB
Blood, UA: NEGATIVE
Glucose, UA: NEGATIVE
Ketones, UA: NEGATIVE
Leukocytes, UA: NEGATIVE
Nitrite, UA: NEGATIVE
POC,PROTEIN,UA: NEGATIVE

## 2019-02-20 NOTE — Progress Notes (Signed)
[place] 12+2 wks,measurements c/w dates,crl 66.23 mm,normal ovaries bilat,fhr 162 bpm,NB present,NT 2.1 mm
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
## 2019-02-20 NOTE — Patient Instructions (Signed)
 First Trimester of Pregnancy The first trimester of pregnancy is from week 1 until the end of week 12 (months 1 through 3). A week after a sperm fertilizes an egg, the egg will implant on the wall of the uterus. This embryo will begin to develop into a baby. Genes from you and your partner are forming the baby. The female genes determine whether the baby is a boy or a girl. At 6-8 weeks, the eyes and face are formed, and the heartbeat can be seen on ultrasound. At the end of 12 weeks, all the baby's organs are formed.  Now that you are pregnant, you will want to do everything you can to have a healthy baby. Two of the most important things are to get good prenatal care and to follow your health care provider's instructions. Prenatal care is all the medical care you receive before the baby's birth. This care will help prevent, find, and treat any problems during the pregnancy and childbirth. BODY CHANGES Your body goes through many changes during pregnancy. The changes vary from woman to woman.   You may gain or lose a couple of pounds at first.  You may feel sick to your stomach (nauseous) and throw up (vomit). If the vomiting is uncontrollable, call your health care provider.  You may tire easily.  You may develop headaches that can be relieved by medicines approved by your health care provider.  You may urinate more often. Painful urination may mean you have a bladder infection.  You may develop heartburn as a result of your pregnancy.  You may develop constipation because certain hormones are causing the muscles that push waste through your intestines to slow down.  You may develop hemorrhoids or swollen, bulging veins (varicose veins).  Your breasts may begin to grow larger and become tender. Your nipples may stick out more, and the tissue that surrounds them (areola) may become darker.  Your gums may bleed and may be sensitive to brushing and flossing.  Dark spots or blotches  (chloasma, mask of pregnancy) may develop on your face. This will likely fade after the baby is born.  Your menstrual periods will stop.  You may have a loss of appetite.  You may develop cravings for certain kinds of food.  You may have changes in your emotions from day to day, such as being excited to be pregnant or being concerned that something may go wrong with the pregnancy and baby.  You may have more vivid and strange dreams.  You may have changes in your hair. These can include thickening of your hair, rapid growth, and changes in texture. Some women also have hair loss during or after pregnancy, or hair that feels dry or thin. Your hair will most likely return to normal after your baby is born. WHAT TO EXPECT AT YOUR PRENATAL VISITS During a routine prenatal visit:  You will be weighed to make sure you and the baby are growing normally.  Your blood pressure will be taken.  Your abdomen will be measured to track your baby's growth.  The fetal heartbeat will be listened to starting around week 10 or 12 of your pregnancy.  Test results from any previous visits will be discussed. Your health care provider may ask you:  How you are feeling.  If you are feeling the baby move.  If you have had any abnormal symptoms, such as leaking fluid, bleeding, severe headaches, or abdominal cramping.  If you have any questions. Other   tests that may be performed during your first trimester include:  Blood tests to find your blood type and to check for the presence of any previous infections. They will also be used to check for low iron levels (anemia) and Rh antibodies. Later in the pregnancy, blood tests for diabetes will be done along with other tests if problems develop.  Urine tests to check for infections, diabetes, or protein in the urine.  An ultrasound to confirm the proper growth and development of the baby.  An amniocentesis to check for possible genetic problems.  Fetal  screens for spina bifida and Down syndrome.  You may need other tests to make sure you and the baby are doing well. HOME CARE INSTRUCTIONS  Medicines  Follow your health care provider's instructions regarding medicine use. Specific medicines may be either safe or unsafe to take during pregnancy.  Take your prenatal vitamins as directed.  If you develop constipation, try taking a stool softener if your health care provider approves. Diet  Eat regular, well-balanced meals. Choose a variety of foods, such as meat or vegetable-based protein, fish, milk and low-fat dairy products, vegetables, fruits, and whole grain breads and cereals. Your health care provider will help you determine the amount of weight gain that is right for you.  Avoid raw meat and uncooked cheese. These carry germs that can cause birth defects in the baby.  Eating four or five small meals rather than three large meals a day may help relieve nausea and vomiting. If you start to feel nauseous, eating a few soda crackers can be helpful. Drinking liquids between meals instead of during meals also seems to help nausea and vomiting.  If you develop constipation, eat more high-fiber foods, such as fresh vegetables or fruit and whole grains. Drink enough fluids to keep your urine clear or pale yellow. Activity and Exercise  Exercise only as directed by your health care provider. Exercising will help you:  Control your weight.  Stay in shape.  Be prepared for labor and delivery.  Experiencing pain or cramping in the lower abdomen or low back is a good sign that you should stop exercising. Check with your health care provider before continuing normal exercises.  Try to avoid standing for long periods of time. Move your legs often if you must stand in one place for a long time.  Avoid heavy lifting.  Wear low-heeled shoes, and practice good posture.  You may continue to have sex unless your health care provider directs you  otherwise. Relief of Pain or Discomfort  Wear a good support bra for breast tenderness.   Take warm sitz baths to soothe any pain or discomfort caused by hemorrhoids. Use hemorrhoid cream if your health care provider approves.   Rest with your legs elevated if you have leg cramps or low back pain.  If you develop varicose veins in your legs, wear support hose. Elevate your feet for 15 minutes, 3-4 times a day. Limit salt in your diet. Prenatal Care  Schedule your prenatal visits by the twelfth week of pregnancy. They are usually scheduled monthly at first, then more often in the last 2 months before delivery.  Write down your questions. Take them to your prenatal visits.  Keep all your prenatal visits as directed by your health care provider. Safety  Wear your seat belt at all times when driving.  Make a list of emergency phone numbers, including numbers for family, friends, the hospital, and police and fire departments. General   Tips  Ask your health care provider for a referral to a local prenatal education class. Begin classes no later than at the beginning of month 6 of your pregnancy.  Ask for help if you have counseling or nutritional needs during pregnancy. Your health care provider can offer advice or refer you to specialists for help with various needs.  Do not use hot tubs, steam rooms, or saunas.  Do not douche or use tampons or scented sanitary pads.  Do not cross your legs for long periods of time.  Avoid cat litter boxes and soil used by cats. These carry germs that can cause birth defects in the baby and possibly loss of the fetus by miscarriage or stillbirth.  Avoid all smoking, herbs, alcohol, and medicines not prescribed by your health care provider. Chemicals in these affect the formation and growth of the baby.  Schedule a dentist appointment. At home, brush your teeth with a soft toothbrush and be gentle when you floss. SEEK MEDICAL CARE IF:   You have  dizziness.  You have mild pelvic cramps, pelvic pressure, or nagging pain in the abdominal area.  You have persistent nausea, vomiting, or diarrhea.  You have a bad smelling vaginal discharge.  You have pain with urination.  You notice increased swelling in your face, hands, legs, or ankles. SEEK IMMEDIATE MEDICAL CARE IF:   You have a fever.  You are leaking fluid from your vagina.  You have spotting or bleeding from your vagina.  You have severe abdominal cramping or pain.  You have rapid weight gain or loss.  You vomit blood or material that looks like coffee grounds.  You are exposed to German measles and have never had them.  You are exposed to fifth disease or chickenpox.  You develop a severe headache.  You have shortness of breath.  You have any kind of trauma, such as from a fall or a car accident. Document Released: 06/05/2001 Document Revised: 10/26/2013 Document Reviewed: 04/21/2013 [employer] Patient Information 2015 [employer]. This information is not intended to replace advice given to you by your health care provider. Make sure you discuss any questions you have with your health care provider.   Nausea & Vomiting  Have saltine crackers or pretzels by your bed and eat a few bites before you raise your head out of bed in the morning  Eat small frequent meals throughout the day instead of large meals  Drink plenty of fluids throughout the day to stay hydrated, just don't drink a lot of fluids with your meals.  This can make your stomach fill up faster making you feel sick  Do not brush your teeth right after you eat  Products with real ginger are good for nausea, like ginger ale and ginger hard candy Make sure it says made with real ginger!  Sucking on sour candy like lemon heads is also good for nausea  If your prenatal vitamins make you nauseated, take them at night so you will sleep through the nausea  Sea Bands  If you feel like you need  medicine for the nausea & vomiting please let us know  If you are unable to keep any fluids or food down please let us know   Constipation  Drink plenty of fluid, preferably water, throughout the day  Eat foods high in fiber such as fruits, vegetables, and grains  Exercise, such as walking, is a good way to keep your bowels regular  Drink warm fluids, especially warm   prune juice, or decaf coffee  Eat a 1/2 cup of real oatmeal (not instant), 1/2 cup applesauce, and 1/2-1 cup warm prune juice every day  If needed, you may take Colace (docusate sodium) stool softener once or twice a day to help keep the stool soft. If you are pregnant, wait until you are out of your first trimester (12-14 weeks of pregnancy)  If you still are having problems with constipation, you may take Miralax once daily as needed to help keep your bowels regular.  If you are pregnant, wait until you are out of your first trimester (12-14 weeks of pregnancy)  Safe Medications in Pregnancy   Acne: Benzoyl Peroxide Salicylic Acid  Backache/Headache: Tylenol: 2 regular strength every 4 hours OR              2 Extra strength every 6 hours  Colds/Coughs/Allergies: Benadryl (alcohol free) 25 mg every 6 hours as needed Breath right strips Claritin Cepacol throat lozenges Chloraseptic throat spray Cold-Eeze- up to three times per day Cough drops, alcohol free Flonase (by prescription only) Guaifenesin Mucinex Robitussin DM (plain only, alcohol free) Saline nasal spray/drops Sudafed (pseudoephedrine) & Actifed ** use only after [redacted] weeks gestation and if you do not have high blood pressure Tylenol Vicks Vaporub Zinc lozenges Zyrtec   Constipation: Colace Ducolax suppositories Fleet enema Glycerin suppositories Metamucil Milk of magnesia Miralax Senokot Smooth move tea  Diarrhea: Kaopectate Imodium A-D  *NO pepto Bismol  Hemorrhoids: Anusol Anusol HC Preparation  H Tucks  Indigestion: Tums Maalox Mylanta Zantac  Pepcid  Insomnia: Benadryl (alcohol free) 25mg every 6 hours as needed Tylenol PM Unisom, no Gelcaps  Leg Cramps: Tums MagGel  Nausea/Vomiting:  Bonine Dramamine Emetrol Ginger extract Sea bands Meclizine  Nausea medication to take during pregnancy:  Unisom (doxylamine succinate 25 mg tablets) Take one tablet daily at bedtime. If symptoms are not adequately controlled, the dose can be increased to a maximum recommended dose of two tablets daily (1/2 tablet in the morning, 1/2 tablet mid-afternoon and one at bedtime). Vitamin B6 100mg tablets. Take one tablet twice a day (up to 200 mg per day).  Skin Rashes: Aveeno products Benadryl cream or 25mg every 6 hours as needed Calamine Lotion 1% cortisone cream  Yeast infection: Gyne-lotrimin 7 Monistat 7   **If taking multiple medications, please check labels to avoid duplicating the same active ingredients **take medication as directed on the label ** Do not exceed 4000 mg of tylenol in 24 hours **Do not take medications that contain aspirin or ibuprofen      

## 2019-02-21 LAB — HEMOGLOBIN A1C
Est. average glucose Bld gHb Est-mCnc: 97 mg/dL
Hgb A1c MFr Bld: 5 % (ref 4.8–5.6)

## 2019-02-22 LAB — URINE CULTURE

## 2019-02-22 LAB — MED LIST OPTION NOT SELECTED

## 2019-02-23 LAB — PMP SCREEN PROFILE (10S), URINE
Amphetamine Scrn, Ur: NEGATIVE ng/mL
BARBITURATE SCREEN URINE: NEGATIVE ng/mL
BENZODIAZEPINE SCREEN, URINE: NEGATIVE ng/mL
CANNABINOIDS UR QL SCN: NEGATIVE ng/mL
Cocaine (Metab) Scrn, Ur: NEGATIVE ng/mL
Creatinine(Crt), U: 168.2 mg/dL (ref 20.0–300.0)
Methadone Screen, Urine: NEGATIVE ng/mL
OXYCODONE+OXYMORPHONE UR QL SCN: NEGATIVE ng/mL
Opiate Scrn, Ur: NEGATIVE ng/mL
Ph of Urine: 6.5 (ref 4.5–8.9)
Phencyclidine Qn, Ur: NEGATIVE ng/mL
Propoxyphene Scrn, Ur: NEGATIVE ng/mL

## 2019-02-24 LAB — GC/CHLAMYDIA PROBE AMP
Chlamydia trachomatis, NAA: NEGATIVE
Neisseria Gonorrhoeae by PCR: NEGATIVE

## 2019-02-24 LAB — INTEGRATED 1
Crown Rump Length: 66.2 mm
Gest. Age on Collection Date: 12.7 weeks
Maternal Age at EDD: 21.3 yr
Nuchal Translucency (NT): 2.1 mm
Number of Fetuses: 1
PAPP-A Value: 248.7 ng/mL
Weight: 274 [lb_av]

## 2019-02-24 LAB — URINALYSIS, ROUTINE W REFLEX MICROSCOPIC
Bilirubin, UA: NEGATIVE
Glucose, UA: NEGATIVE
Ketones, UA: NEGATIVE
Leukocytes,UA: NEGATIVE
Nitrite, UA: NEGATIVE
Protein,UA: NEGATIVE
RBC, UA: NEGATIVE
Specific Gravity, UA: 1.027 (ref 1.005–1.030)
Urobilinogen, Ur: 0.2 mg/dL (ref 0.2–1.0)
pH, UA: 6.5 (ref 5.0–7.5)

## 2019-02-24 LAB — OBSTETRIC PANEL, INCLUDING HIV
Antibody Screen: NEGATIVE
Basophils Absolute: 0 10*3/uL (ref 0.0–0.2)
Basos: 0 %
EOS (ABSOLUTE): 0 10*3/uL (ref 0.0–0.4)
Eos: 1 %
HIV Screen 4th Generation wRfx: NONREACTIVE
Hematocrit: 38.6 % (ref 34.0–46.6)
Hemoglobin: 12.6 g/dL (ref 11.1–15.9)
Hepatitis B Surface Ag: NEGATIVE
Immature Grans (Abs): 0 10*3/uL (ref 0.0–0.1)
Immature Granulocytes: 0 %
Lymphocytes Absolute: 1.8 10*3/uL (ref 0.7–3.1)
Lymphs: 29 %
MCH: 28.8 pg (ref 26.6–33.0)
MCHC: 32.6 g/dL (ref 31.5–35.7)
MCV: 88 fL (ref 79–97)
Monocytes Absolute: 0.3 10*3/uL (ref 0.1–0.9)
Monocytes: 6 %
Neutrophils Absolute: 3.9 10*3/uL (ref 1.4–7.0)
Neutrophils: 64 %
Platelets: 178 10*3/uL (ref 150–450)
RBC: 4.38 x10E6/uL (ref 3.77–5.28)
RDW: 12.3 % (ref 11.7–15.4)
RPR Ser Ql: NONREACTIVE
Rh Factor: POSITIVE
Rubella Antibodies, IGG: 3.19 index (ref >0.99)
WBC: 6 10*3/uL (ref 3.4–10.8)

## 2019-03-05 LAB — MATERNIT 21 PLUS CORE, BLOOD
Fetal Fraction: 7
Result (T21): NEGATIVE
Trisomy 13 (Patau syndrome): NEGATIVE
Trisomy 18 (Edwards syndrome): NEGATIVE
Trisomy 21 (Down syndrome): NEGATIVE

## 2019-03-07 LAB — SMN1 COPY NUMBER ANALYSIS (SMA CARRIER SCREENING)

## 2019-03-07 LAB — FRAGILE X, PCR AND SOUTHERN

## 2019-03-13 ENCOUNTER — Encounter: Payer: Self-pay | Admitting: Women's Health

## 2019-03-13 ENCOUNTER — Ambulatory Visit (INDEPENDENT_AMBULATORY_CARE_PROVIDER_SITE_OTHER): Payer: Medicaid Other | Admitting: Women's Health

## 2019-03-13 ENCOUNTER — Other Ambulatory Visit: Payer: Self-pay

## 2019-03-13 VITALS — BP 122/74 | HR 94 | Wt 276.5 lb

## 2019-03-13 DIAGNOSIS — Z331 Pregnant state, incidental: Secondary | ICD-10-CM

## 2019-03-13 DIAGNOSIS — Z1379 Encounter for other screening for genetic and chromosomal anomalies: Secondary | ICD-10-CM

## 2019-03-13 DIAGNOSIS — Z3482 Encounter for supervision of other normal pregnancy, second trimester: Secondary | ICD-10-CM

## 2019-03-13 DIAGNOSIS — Z3A15 15 weeks gestation of pregnancy: Secondary | ICD-10-CM

## 2019-03-13 DIAGNOSIS — Z1389 Encounter for screening for other disorder: Secondary | ICD-10-CM

## 2019-03-13 DIAGNOSIS — Z363 Encounter for antenatal screening for malformations: Secondary | ICD-10-CM

## 2019-03-13 LAB — POCT URINALYSIS DIPSTICK OB
Blood, UA: NEGATIVE
Glucose, UA: NEGATIVE
Ketones, UA: NEGATIVE
Leukocytes, UA: NEGATIVE
Nitrite, UA: NEGATIVE

## 2019-03-13 NOTE — Patient Instructions (Signed)
Molly Zimmerman, I greatly value your feedback.  If you receive a survey following your visit with [place] today, we appreciate you taking the time to fill it out.  Thanks, Knute Neu, CNM, [facility]  [employer]!!! It is now [place] at [facility] ([place], [place] [address]) Entrance located off of [address] parking   Go to [email or web address] to register for FREE online childbirth classes  [place] Pediatricians/Family Doctors:  [facility] [phone]                 [place] [phone] (usually not accepting new patients unless you have family there already, you are always welcome to call and ask)       [facility] Department [phone]       [facility] Pediatricians/Family Doctors:   [facility]: [phone]  [facility]: [phone]  [facility]: [place] Doctors:   [facility]: [facility]: [place]:  [place]: [phone]    Home Blood Pressure Monitoring for Patients   Your provider has recommended that you check your blood pressure (BP) at least once a week at home. If you do not have a blood pressure cuff at home, one will be provided for you. Contact your provider if you have not received your monitor within 1 week.   Helpful Tips for Accurate Home Blood Pressure Checks  . Don't smoke, exercise, or drink caffeine 30 minutes before checking your BP . Use the restroom before checking your BP (a full bladder can raise your pressure) . Relax in a comfortable upright chair . Feet on the ground . Left arm resting comfortably on a flat surface at the level of your heart . Legs uncrossed . Back supported . Sit quietly and don't talk . Place the cuff on  your bare arm . Adjust snuggly, so that only two fingertips can fit between your skin and the top of the cuff . Check 2 readings separated by at least one minute . Keep a log of your BP readings . For a visual, please reference this diagram: http://ccnc.care/bpdiagram  Provider Name: [facility]     Phone: [phone]  Zone 1: ALL CLEAR  Continue to monitor your symptoms:  . BP reading is less than 140 (top number) or less than 90 (bottom number)  . No right upper stomach pain . No headaches or seeing spots . No feeling nauseated or throwing up . No swelling in face and hands  Zone 2: CAUTION Call your doctor's office for any of the following:  . BP reading is greater than 140 (top number) or greater than 90 (bottom number)  . Stomach pain under your ribs in the middle or right side . Headaches or seeing spots . Feeling nauseated or throwing up . Swelling in face and hands  Zone 3: EMERGENCY  Seek immediate medical care if you have any of the following:  . BP reading is greater than160 (top number) or greater than 110 (bottom number) . Severe headaches not improving with Tylenol . Serious difficulty catching your breath . Any worsening symptoms from Zone 2     Second Trimester of Pregnancy The second trimester is from week 14 through week 27 (months 4 through 6). The second trimester is often  a time when you feel your best. Your body has adjusted to being pregnant, and you begin to feel better physically. Usually, morning sickness has lessened or quit completely, you may have more energy, and you may have an increase in appetite. The second trimester is also a time when the fetus is growing rapidly. At the end of the sixth month, the fetus is about 9 inches long and weighs about 1 pounds. You will likely begin to feel the baby move (quickening) between 16 and 20 weeks of pregnancy. Body changes during your second trimester Your body continues to go through many changes  during your second trimester. The changes vary from woman to woman.  Your weight will continue to increase. You will notice your lower abdomen bulging out.  You may begin to get stretch marks on your hips, abdomen, and breasts.  You may develop headaches that can be relieved by medicines. The medicines should be approved by your health care provider.  You may urinate more often because the fetus is pressing on your bladder.  You may develop or continue to have heartburn as a result of your pregnancy.  You may develop constipation because certain hormones are causing the muscles that push waste through your intestines to slow down.  You may develop hemorrhoids or swollen, bulging veins (varicose veins).  You may have back pain. This is caused by: ? Weight gain. ? Pregnancy hormones that are relaxing the joints in your pelvis. ? A shift in weight and the muscles that support your balance.  Your breasts will continue to grow and they will continue to become tender.  Your gums may bleed and may be sensitive to brushing and flossing.  Dark spots or blotches (chloasma, mask of pregnancy) may develop on your face. This will likely fade after the baby is born.  A dark line from your belly button to the pubic area (linea nigra) may appear. This will likely fade after the baby is born.  You may have changes in your hair. These can include thickening of your hair, rapid growth, and changes in texture. Some women also have hair loss during or after pregnancy, or hair that feels dry or thin. Your hair will most likely return to normal after your baby is born.  What to expect at prenatal visits During a routine prenatal visit:  You will be weighed to make sure you and the fetus are growing normally.  Your blood pressure will be taken.  Your abdomen will be measured to track your baby's growth.  The fetal heartbeat will be listened to.  Any test results from the previous visit will be  discussed.  Your health care provider may ask you:  How you are feeling.  If you are feeling the baby move.  If you have had any abnormal symptoms, such as leaking fluid, bleeding, severe headaches, or abdominal cramping.  If you are using any tobacco products, including cigarettes, chewing tobacco, and electronic cigarettes.  If you have any questions.  Other tests that may be performed during your second trimester include:  Blood tests that check for: ? Low iron levels (anemia). ? High blood sugar that affects pregnant women (gestational diabetes) between 16 and 28 weeks. ? Rh antibodies. This is to check for a protein on red blood cells (Rh factor).  Urine tests to check for infections, diabetes, or protein in the urine.  An ultrasound to confirm the proper growth and development of the baby.  An amniocentesis to check  for possible genetic problems.  Fetal screens for spina bifida and Down syndrome.  HIV (human immunodeficiency virus) testing. Routine prenatal testing includes screening for HIV, unless you choose not to have this test.  Follow these instructions at home: Medicines  Follow your health care provider's instructions regarding medicine use. Specific medicines may be either safe or unsafe to take during pregnancy.  Take a prenatal vitamin that contains at least 600 micrograms (mcg) of folic acid.  If you develop constipation, try taking a stool softener if your health care provider approves. Eating and drinking  Eat a balanced diet that includes fresh fruits and vegetables, whole grains, good sources of protein such as meat, eggs, or tofu, and low-fat dairy. Your health care provider will help you determine the amount of weight gain that is right for you.  Avoid raw meat and uncooked cheese. These carry germs that can cause birth defects in the baby.  If you have low calcium intake from food, talk to your health care provider about whether you should take a  daily calcium supplement.  Limit foods that are high in fat and processed sugars, such as fried and sweet foods.  To prevent constipation: ? Drink enough fluid to keep your urine clear or pale yellow. ? Eat foods that are high in fiber, such as fresh fruits and vegetables, whole grains, and beans. Activity  Exercise only as directed by your health care provider. Most women can continue their usual exercise routine during pregnancy. Try to exercise for 30 minutes at least 5 days a week. Stop exercising if you experience uterine contractions.  Avoid heavy lifting, wear low heel shoes, and practice good posture.  A sexual relationship may be continued unless your health care provider directs you otherwise. Relieving pain and discomfort  Wear a good support bra to prevent discomfort from breast tenderness.  Take warm sitz baths to soothe any pain or discomfort caused by hemorrhoids. Use hemorrhoid cream if your health care provider approves.  Rest with your legs elevated if you have leg cramps or low back pain.  If you develop varicose veins, wear support hose. Elevate your feet for 15 minutes, 3-4 times a day. Limit salt in your diet. Prenatal Care  Write down your questions. Take them to your prenatal visits.  Keep all your prenatal visits as told by your health care provider. This is important. Safety  Wear your seat belt at all times when driving.  Make a list of emergency phone numbers, including numbers for family, friends, the hospital, and police and fire departments. General instructions  Ask your health care provider for a referral to a local prenatal education class. Begin classes no later than the beginning of month 6 of your pregnancy.  Ask for help if you have counseling or nutritional needs during pregnancy. Your health care provider can offer advice or refer you to specialists for help with various needs.  Do not use hot tubs, steam rooms, or saunas.  Do not  douche or use tampons or scented sanitary pads.  Do not cross your legs for long periods of time.  Avoid cat litter boxes and soil used by cats. These carry germs that can cause birth defects in the baby and possibly loss of the fetus by miscarriage or stillbirth.  Avoid all smoking, herbs, alcohol, and unprescribed drugs. Chemicals in these products can affect the formation and growth of the baby.  Do not use any products that contain nicotine or tobacco, such as cigarettes  and e-cigarettes. If you need help quitting, ask your health care provider.  Visit your dentist if you have not gone yet during your pregnancy. Use a soft toothbrush to brush your teeth and be gentle when you floss. Contact a health care provider if:  You have dizziness.  You have mild pelvic cramps, pelvic pressure, or nagging pain in the abdominal area.  You have persistent nausea, vomiting, or diarrhea.  You have a bad smelling vaginal discharge.  You have pain when you urinate. Get help right away if:  You have a fever.  You are leaking fluid from your vagina.  You have spotting or bleeding from your vagina.  You have severe abdominal cramping or pain.  You have rapid weight gain or weight loss.  You have shortness of breath with chest pain.  You notice sudden or extreme swelling of your face, hands, ankles, feet, or legs.  You have not felt your baby move in over an hour.  You have severe headaches that do not go away when you take medicine.  You have vision changes. Summary  The second trimester is from week 14 through week 27 (months 4 through 6). It is also a time when the fetus is growing rapidly.  Your body goes through many changes during pregnancy. The changes vary from woman to woman.  Avoid all smoking, herbs, alcohol, and unprescribed drugs. These chemicals affect the formation and growth your baby.  Do not use any tobacco products, such as cigarettes, chewing tobacco, and  e-cigarettes. If you need help quitting, ask your health care provider.  Contact your health care provider if you have any questions. Keep all prenatal visits as told by your health care provider. This is important. This information is not intended to replace advice given to you by your health care provider. Make sure you discuss any questions you have with your health care provider. Document Released: 06/05/2001 Document Revised: 11/17/2015 Document Reviewed: 08/12/2012 Elsevier Interactive Patient Education  2017 [employer].

## 2019-03-13 NOTE — Progress Notes (Signed)
   LOW-RISK PREGNANCY VISIT Patient name: Molly Zimmerman MRN [id number]  Date of birth: [birth year] Chief Complaint:   Routine Prenatal Visit  History of Present Illness:   Molly Zimmerman is a 23 y.o. G64P2002 female at [redacted]w[redacted]d with an Estimated Date of Delivery: 09/02/19 being seen today for ongoing management of a low-risk pregnancy.  Today she reports no complaints. Contractions: Not present. Vag. Bleeding: None.  Movement: Absent. denies leaking of fluid. Review of Systems:   Pertinent items are noted in HPI Denies abnormal vaginal discharge w/ itching/odor/irritation, headaches, visual changes, shortness of breath, chest pain, abdominal pain, severe nausea/vomiting, or problems with urination or bowel movements unless otherwise stated above. Pertinent History Reviewed:  Reviewed past medical,surgical, social, obstetrical and family history.  Reviewed problem list, medications and allergies. Physical Assessment:   Vitals:   03/13/19 1051  BP: 122/74  Pulse: 94  Weight: 276 lb 8 oz (125.4 kg)  Body mass index is 47.46 kg/m.        Physical Examination:   General appearance: Well appearing, and in no distress  Mental status: Alert, oriented to person, place, and time  Skin: Warm & dry  Cardiovascular: Normal heart rate noted  Respiratory: Normal respiratory effort, no distress  Abdomen: Soft, gravid, nontender  Pelvic: Cervical exam deferred         Extremities: Edema: None  Fetal Status: Fetal Heart Rate (bpm): +u/s   Movement: Absent    Informal transabdominal u/s, +FCA and active fetus, was unable to hear w/ doppler  Results for orders placed or performed in visit on 03/13/19 (from the past 24 hour(s))  POC Urinalysis Dipstick OB   Collection Time: 03/13/19 10:52 AM  Result Value Ref Range   Color, UA     Clarity, UA     Glucose, UA Negative Negative   Bilirubin, UA     Ketones, UA neg    Spec Grav, UA     Blood, UA neg    pH, UA     POC,PROTEIN,UA Trace Negative, Trace,  Small (1+), Moderate (2+), Large (3+), 4+   Urobilinogen, UA     Nitrite, UA neg    Leukocytes, UA Negative Negative   Appearance     Odor      Assessment & Plan:  1) Low-risk pregnancy G3P2002 at [redacted]w[redacted]d with an Estimated Date of Delivery: 09/02/19    Meds: No orders of the defined types were placed in this encounter.  Labs/procedures today: 2nd IT  Plan:  Continue routine obstetrical care   Reviewed: Preterm labor symptoms and general obstetric precautions including but not limited to vaginal bleeding, contractions, leaking of fluid and fetal movement were reviewed in detail with the patient.  All questions were answered.  Follow-up: Return in about 4 weeks (around 04/10/2019) for LROB, [id number], in person.  Orders Placed This Encounter  Procedures  . US OB Comp + 14 Wk  . INTEGRATED 2  . POC Urinalysis Dipstick OB   Roma Schanz CNM, [facility] 03/13/2019 11:18 AM

## 2019-03-18 LAB — INTEGRATED 2
AFP MoM: 0.73
Alpha-Fetoprotein: 12.7 ng/mL
Crown Rump Length: 66.2 mm
DIA MoM: 0.29
DIA Value: 34.9 pg/mL
Estriol, Unconjugated: 0.53 ng/mL
Gest. Age on Collection Date: 12.7 weeks
Gestational Age: 15.7 weeks
Maternal Age at EDD: 21.3 yr
Nuchal Translucency (NT): 2.1 mm
Nuchal Translucency MoM: 1.41
Number of Fetuses: 1
PAPP-A MoM: 0.47
PAPP-A Value: 248.7 ng/mL
Test Results:: NEGATIVE
Weight: 274 [lb_av]
Weight: 277 [lb_av]
hCG MoM: 0.79
hCG Value: 18.9 IU/mL
uE3 MoM: 0.74

## 2019-04-10 ENCOUNTER — Other Ambulatory Visit: Payer: Self-pay

## 2019-04-10 ENCOUNTER — Ambulatory Visit (INDEPENDENT_AMBULATORY_CARE_PROVIDER_SITE_OTHER): Payer: Medicaid Other

## 2019-04-10 ENCOUNTER — Other Ambulatory Visit (HOSPITAL_COMMUNITY)
Admission: RE | Admit: 2019-04-10 | Discharge: 2019-04-10 | Disposition: A | Discharge status: Home / Self Care | Payer: Medicaid Other | Source: Ambulatory Visit | Attending: Obstetrics & Gynecology | Admitting: Obstetrics & Gynecology

## 2019-04-10 ENCOUNTER — Ambulatory Visit (INDEPENDENT_AMBULATORY_CARE_PROVIDER_SITE_OTHER): Payer: Medicaid Other | Admitting: Women's Health

## 2019-04-10 ENCOUNTER — Encounter: Payer: Self-pay | Admitting: Women's Health

## 2019-04-10 VITALS — BP 112/66 | HR 83 | Wt 281.4 lb

## 2019-04-10 DIAGNOSIS — Z3A19 19 weeks gestation of pregnancy: Secondary | ICD-10-CM | POA: Diagnosis not present

## 2019-04-10 DIAGNOSIS — Z331 Pregnant state, incidental: Secondary | ICD-10-CM | POA: Diagnosis present

## 2019-04-10 DIAGNOSIS — Z113 Encounter for screening for infections with a predominantly sexual mode of transmission: Secondary | ICD-10-CM | POA: Diagnosis present

## 2019-04-10 DIAGNOSIS — Z1389 Encounter for screening for other disorder: Secondary | ICD-10-CM

## 2019-04-10 DIAGNOSIS — Z3482 Encounter for supervision of other normal pregnancy, second trimester: Secondary | ICD-10-CM

## 2019-04-10 DIAGNOSIS — Z363 Encounter for antenatal screening for malformations: Secondary | ICD-10-CM | POA: Diagnosis not present

## 2019-04-10 LAB — POCT URINALYSIS DIPSTICK OB
Blood, UA: NEGATIVE
Glucose, UA: NEGATIVE
Ketones, UA: NEGATIVE
Leukocytes, UA: NEGATIVE
Nitrite, UA: NEGATIVE
POC,PROTEIN,UA: NEGATIVE

## 2019-04-10 MED ORDER — BLOOD PRESSURE MONITOR MISC: 0 refills | Status: DC

## 2019-04-10 NOTE — Progress Notes (Signed)
[place] 19+2 wks,breech,anterior placenta gr 0,normal ovaries bilat,cx 5.3 cm,svp of fluid 6 cm,fhr 152 bpm,EFW 318 g 77%,anatomy complete,no obvious abnormalities

## 2019-04-10 NOTE — Patient Instructions (Signed)
Molly Zimmerman, I greatly value your feedback.  If you receive a survey following your visit with us today, we appreciate you taking the time to fill it out.  Thanks, Molly Zimmerman, CNM, WHNP-BC  [facility] It is now [facility] ([address] [place], [place] [address]) Entrance located off of [address] Free 24/7 valet parking   Go to [email or web address] to register for FREE online childbirth classes  [place] Pediatricians/Family Doctors:  [facility] [phone]            [facility] [phone]                 [facility] [phone] (usually not accepting new patients unless you have family there already, you are always welcome to call and ask)       [facility] [phone]       [place] Pediatricians/Family Doctors:   [facility]: [phone]  [facility]: [phone]  [facility]: [phone]  [place] Family Doctors:   [facility]: [phone]   [facility]: [phone]  [place] Family Doctors:  [facility]: [phone]    Home Blood Pressure Monitoring for Patients   Your provider has recommended that you check your blood pressure (BP) at least once a week at home. If you do not have a blood pressure cuff at home, one will be provided for you. Contact your provider if you have not received your monitor within 1 week.   Helpful Tips for Accurate Home Blood Pressure Checks  . Don't smoke, exercise, or drink caffeine 30 minutes before checking your BP . Use the restroom before checking your BP (a full bladder can raise your pressure) . Relax in a comfortable upright chair . Feet on the ground . Left arm resting comfortably on a flat surface at the level of your heart . Legs uncrossed . Back supported . Sit quietly and don't talk . Place the cuff on  your bare arm . Adjust snuggly, so that only two fingertips can fit between your skin and the top of the cuff . Check 2 readings separated by at least one minute . Keep a log of your BP readings . For a visual, please reference this diagram: http://ccnc.care/bpdiagram  Provider Name: [facility]     Phone: [phone]  Zone 1: ALL CLEAR  Continue to monitor your symptoms:  . BP reading is less than 140 (top number) or less than 90 (bottom number)  . No right upper stomach pain . No headaches or seeing spots . No feeling nauseated or throwing up . No swelling in face and hands  Zone 2: CAUTION Call your doctor's office for any of the following:  . BP reading is greater than 140 (top number) or greater than 90 (bottom number)  . Stomach pain under your ribs in the middle or right side . Headaches or seeing spots . Feeling nauseated or throwing up . Swelling in face and hands  Zone 3: EMERGENCY  Seek immediate medical care if you have any of the following:  . BP reading is greater than160 (top number) or greater than 110 (bottom number) . Severe headaches not improving with Tylenol . Serious difficulty catching your breath . Any worsening symptoms from Zone 2     Second Trimester of Pregnancy The second trimester is from week 14 through week 27 (months 4 through 6). The second trimester is often   a time when you feel your best. Your body has adjusted to being pregnant, and you begin to feel better physically. Usually, morning sickness has lessened or quit completely, you may have more energy, and you may have an increase in appetite. The second trimester is also a time when the fetus is growing rapidly. At the end of the sixth month, the fetus is about 9 inches long and weighs about 1 pounds. You will likely begin to feel the baby move (quickening) between 16 and 20 weeks of pregnancy. Body changes during your second trimester Your body continues to go through many changes  during your second trimester. The changes vary from woman to woman.  Your weight will continue to increase. You will notice your lower abdomen bulging out.  You may begin to get stretch marks on your hips, abdomen, and breasts.  You may develop headaches that can be relieved by medicines. The medicines should be approved by your health care provider.  You may urinate more often because the fetus is pressing on your bladder.  You may develop or continue to have heartburn as a result of your pregnancy.  You may develop constipation because certain hormones are causing the muscles that push waste through your intestines to slow down.  You may develop hemorrhoids or swollen, bulging veins (varicose veins).  You may have back pain. This is caused by: ? Weight gain. ? Pregnancy hormones that are relaxing the joints in your pelvis. ? A shift in weight and the muscles that support your balance.  Your breasts will continue to grow and they will continue to become tender.  Your gums may bleed and may be sensitive to brushing and flossing.  Dark spots or blotches (chloasma, mask of pregnancy) may develop on your face. This will likely fade after the baby is born.  A dark line from your belly button to the pubic area (linea nigra) may appear. This will likely fade after the baby is born.  You may have changes in your hair. These can include thickening of your hair, rapid growth, and changes in texture. Some women also have hair loss during or after pregnancy, or hair that feels dry or thin. Your hair will most likely return to normal after your baby is born.  What to expect at prenatal visits During a routine prenatal visit:  You will be weighed to make sure you and the fetus are growing normally.  Your blood pressure will be taken.  Your abdomen will be measured to track your baby's growth.  The fetal heartbeat will be listened to.  Any test results from the previous visit will be  discussed.  Your health care provider may ask you:  How you are feeling.  If you are feeling the baby move.  If you have had any abnormal symptoms, such as leaking fluid, bleeding, severe headaches, or abdominal cramping.  If you are using any tobacco products, including cigarettes, chewing tobacco, and electronic cigarettes.  If you have any questions.  Other tests that may be performed during your second trimester include:  Blood tests that check for: ? Low iron levels (anemia). ? High blood sugar that affects pregnant women (gestational diabetes) between 16 and 28 weeks. ? Rh antibodies. This is to check for a protein on red blood cells (Rh factor).  Urine tests to check for infections, diabetes, or protein in the urine.  An ultrasound to confirm the proper growth and development of the baby.  An amniocentesis to check  for possible genetic problems.  Fetal screens for spina bifida and Down syndrome.  HIV (human immunodeficiency virus) testing. Routine prenatal testing includes screening for HIV, unless you choose not to have this test.  Follow these instructions at home: Medicines  Follow your health care provider's instructions regarding medicine use. Specific medicines may be either safe or unsafe to take during pregnancy.  Take a prenatal vitamin that contains at least 600 micrograms (mcg) of folic acid.  If you develop constipation, try taking a stool softener if your health care provider approves. Eating and drinking  Eat a balanced diet that includes fresh fruits and vegetables, whole grains, good sources of protein such as meat, eggs, or tofu, and low-fat dairy. Your health care provider will help you determine the amount of weight gain that is right for you.  Avoid raw meat and uncooked cheese. These carry germs that can cause birth defects in the baby.  If you have low calcium intake from food, talk to your health care provider about whether you should take a  daily calcium supplement.  Limit foods that are high in fat and processed sugars, such as fried and sweet foods.  To prevent constipation: ? Drink enough fluid to keep your urine clear or pale yellow. ? Eat foods that are high in fiber, such as fresh fruits and vegetables, whole grains, and beans. Activity  Exercise only as directed by your health care provider. Most women can continue their usual exercise routine during pregnancy. Try to exercise for 30 minutes at least 5 days a week. Stop exercising if you experience uterine contractions.  Avoid heavy lifting, wear low heel shoes, and practice good posture.  A sexual relationship may be continued unless your health care provider directs you otherwise. Relieving pain and discomfort  Wear a good support bra to prevent discomfort from breast tenderness.  Take warm sitz baths to soothe any pain or discomfort caused by hemorrhoids. Use hemorrhoid cream if your health care provider approves.  Rest with your legs elevated if you have leg cramps or low back pain.  If you develop varicose veins, wear support hose. Elevate your feet for 15 minutes, 3-4 times a day. Limit salt in your diet. Prenatal Care  Write down your questions. Take them to your prenatal visits.  Keep all your prenatal visits as told by your health care provider. This is important. Safety  Wear your seat belt at all times when driving.  Make a list of emergency phone numbers, including numbers for family, friends, the hospital, and police and fire departments. General instructions  Ask your health care provider for a referral to a local prenatal education class. Begin classes no later than the beginning of month 6 of your pregnancy.  Ask for help if you have counseling or nutritional needs during pregnancy. Your health care provider can offer advice or refer you to specialists for help with various needs.  Do not use hot tubs, steam rooms, or saunas.  Do not  douche or use tampons or scented sanitary pads.  Do not cross your legs for long periods of time.  Avoid cat litter boxes and soil used by cats. These carry germs that can cause birth defects in the baby and possibly loss of the fetus by miscarriage or stillbirth.  Avoid all smoking, herbs, alcohol, and unprescribed drugs. Chemicals in these products can affect the formation and growth of the baby.  Do not use any products that contain nicotine or tobacco, such as cigarettes  and e-cigarettes. If you need help quitting, ask your health care provider.  Visit your dentist if you have not gone yet during your pregnancy. Use a soft toothbrush to brush your teeth and be gentle when you floss. Contact a health care provider if:  You have dizziness.  You have mild pelvic cramps, pelvic pressure, or nagging pain in the abdominal area.  You have persistent nausea, vomiting, or diarrhea.  You have a bad smelling vaginal discharge.  You have pain when you urinate. Get help right away if:  You have a fever.  You are leaking fluid from your vagina.  You have spotting or bleeding from your vagina.  You have severe abdominal cramping or pain.  You have rapid weight gain or weight loss.  You have shortness of breath with chest pain.  You notice sudden or extreme swelling of your face, hands, ankles, feet, or legs.  You have not felt your baby move in over an hour.  You have severe headaches that do not go away when you take medicine.  You have vision changes. Summary  The second trimester is from week 14 through week 27 (months 4 through 6). It is also a time when the fetus is growing rapidly.  Your body goes through many changes during pregnancy. The changes vary from woman to woman.  Avoid all smoking, herbs, alcohol, and unprescribed drugs. These chemicals affect the formation and growth your baby.  Do not use any tobacco products, such as cigarettes, chewing tobacco, and  e-cigarettes. If you need help quitting, ask your health care provider.  Contact your health care provider if you have any questions. Keep all prenatal visits as told by your health care provider. This is important. This information is not intended to replace advice given to you by your health care provider. Make sure you discuss any questions you have with your health care provider. Document Released: 06/05/2001 Document Revised: 11/17/2015 Document Reviewed: 08/12/2012 Elsevier Interactive Patient Education  2017 [employer].

## 2019-04-10 NOTE — Progress Notes (Signed)
LOW-RISK PREGNANCY VISIT Patient name: Molly Zimmerman MRN [id number]  Date of birth: 07-04-1995 Chief Complaint:   Routine Prenatal Visit (u/s)   History of Present Illness:   Molly Zimmerman is a 23 y.o. G45P2002 female at [redacted]w[redacted]d with an Estimated Date of Delivery: 09/02/19 being seen today for ongoing management of a low-risk pregnancy.  Today she reports wants STD screen, back w/ FOB. Contractions: Not present.  .  Movement: Absent. denies leaking of fluid. Review of Systems:   Pertinent items are noted in HPI Denies abnormal vaginal discharge w/ itching/odor/irritation, headaches, visual changes, shortness of breath, chest pain, abdominal pain, severe nausea/vomiting, or problems with urination or bowel movements unless otherwise stated above. Pertinent History Reviewed:  Reviewed past medical,surgical, social, obstetrical and family history.  Reviewed problem list, medications and allergies. Physical Assessment:   Vitals:   04/10/19 0932  BP: 112/66  Pulse: 83  Weight: 281 lb 6.4 oz (127.6 kg)  Body mass index is 48.3 kg/m.        Physical Examination:   General appearance: Well appearing, and in no distress  Mental status: Alert, oriented to person, place, and time  Skin: Warm & dry  Cardiovascular: Normal heart rate noted  Respiratory: Normal respiratory effort, no distress  Abdomen: Soft, gravid, nontender  Pelvic: Cervical exam deferred, self-swab by pt        Extremities: Edema: None  Fetal Status: Fetal Heart Rate (bpm): 152 u/s   Movement: Absent    [place] 19+2 wks,breech,anterior placenta gr 0,normal ovaries bilat,cx 5.3 cm,svp of fluid 6 cm,fhr 152 bpm,EFW 318 g 77%,anatomy complete,no obvious abnormalities   Results for orders placed or performed in visit on 04/10/19 (from the past 24 hour(s))  POC Urinalysis Dipstick OB   Collection Time: 04/10/19  9:31 AM  Result Value Ref Range   Color, UA     Clarity, UA     Glucose, UA Negative Negative   Bilirubin, UA     Ketones, UA neg    Spec Grav, UA     Blood, UA neg    pH, UA     POC,PROTEIN,UA Negative Negative, Trace, Small (1+), Moderate (2+), Large (3+), 4+   Urobilinogen, UA     Nitrite, UA neg    Leukocytes, UA Negative Negative   Appearance     Odor      Assessment & Plan:  1) Low-risk pregnancy G3P2002 at [redacted]w[redacted]d with an Estimated Date of Delivery: 09/02/19   2) STD screen, self-swab sent   Meds:  Meds ordered this encounter  Medications  . Blood Pressure Monitor MISC    Sig: For regular home bp monitoring during pregnancy    Dispense:  1 each    Refill:  0    Dx: z34.90    Order Specific Question:   Supervising Provider    Answer:   Lazaro Arms [[id number]]   Labs/procedures today: anatomy u/s, vaginal swab  Plan:  Continue routine obstetrical care  Next visit: prefers online    Reviewed: Preterm labor symptoms and general obstetric precautions including but not limited to vaginal bleeding, contractions, leaking of fluid and fetal movement were reviewed in detail with the patient.  All questions were answered. Does not have home bp cuff. Rx faxed to [facility]. Check bp weekly, let us know if >140/90.   Follow-up: Return in about 4 weeks (around 05/08/2019) for LROB, MyChart Video, CNM.  Orders Placed This Encounter  Procedures  . POC Urinalysis Dipstick OB   Cala Bradford  Gardner Candle CNM, WHNP-BC 04/10/2019 10:39 AM

## 2019-04-10 NOTE — Addendum Note (Signed)
Addended by: Octaviano Glow on: 04/10/2019 12:53 PM   Modules accepted: Orders

## 2019-04-17 LAB — CERVICOVAGINAL ANCILLARY ONLY
Bacterial Vaginitis (gardnerella): POSITIVE — AB
Chlamydia: NEGATIVE
Comment: NEGATIVE
Comment: NEGATIVE
Comment: NEGATIVE
Comment: NEGATIVE
Comment: NEGATIVE
Comment: NORMAL
Neisseria Gonorrhea: NEGATIVE

## 2019-04-21 ENCOUNTER — Other Ambulatory Visit: Payer: Self-pay | Admitting: Women's Health

## 2019-04-21 MED ORDER — METRONIDAZOLE 500 MG PO TABS: 500.0000 mg | ORAL_TABLET | Freq: Two times a day (BID) | ORAL | 0 refills | Status: DC

## 2019-05-08 ENCOUNTER — Telehealth: Payer: Medicaid Other | Admitting: Women's Health

## 2019-05-26 ENCOUNTER — Encounter: Payer: Self-pay | Admitting: *Deleted

## 2019-05-27 ENCOUNTER — Ambulatory Visit (INDEPENDENT_AMBULATORY_CARE_PROVIDER_SITE_OTHER): Payer: Medicaid Other | Admitting: Advanced Practice Midwife

## 2019-05-27 ENCOUNTER — Other Ambulatory Visit: Payer: Medicaid Other

## 2019-05-27 ENCOUNTER — Other Ambulatory Visit: Payer: Self-pay

## 2019-05-27 VITALS — BP 126/73 | HR 96 | Wt 291.0 lb

## 2019-05-27 DIAGNOSIS — Z3482 Encounter for supervision of other normal pregnancy, second trimester: Secondary | ICD-10-CM

## 2019-05-27 DIAGNOSIS — Z331 Pregnant state, incidental: Secondary | ICD-10-CM

## 2019-05-27 DIAGNOSIS — Z3A26 26 weeks gestation of pregnancy: Secondary | ICD-10-CM

## 2019-05-27 DIAGNOSIS — Z3A25 25 weeks gestation of pregnancy: Secondary | ICD-10-CM

## 2019-05-27 DIAGNOSIS — Z1389 Encounter for screening for other disorder: Secondary | ICD-10-CM

## 2019-05-27 DIAGNOSIS — F1910 Other psychoactive substance abuse, uncomplicated: Secondary | ICD-10-CM

## 2019-05-27 DIAGNOSIS — Z131 Encounter for screening for diabetes mellitus: Secondary | ICD-10-CM

## 2019-05-27 LAB — POCT URINALYSIS DIPSTICK OB
Blood, UA: NEGATIVE
Glucose, UA: NEGATIVE
Ketones, UA: NEGATIVE
Leukocytes, UA: NEGATIVE
Nitrite, UA: NEGATIVE
POC,PROTEIN,UA: NEGATIVE

## 2019-05-27 NOTE — Patient Instructions (Signed)
Molly Zimmerman, I greatly value your feedback.  If you receive a survey following your visit with [place] today, we appreciate you taking the time to fill it out.  Thanks, Philipp Deputy, CNM  [facility] H[facility]PITAL HAS MOVED!!! It is now [facility] ([address] [place], [place] [address]) Entrance located off of [address] Free 24/7 valet parking   Go to [email or web address] to register for FREE online childbirth classes   Pediatricians/Family Doctors:  [facility] [phone]            [facility] [phone]                 [facility] [phone] (usually not accepting new patients unless you have family there already, you are always welcome to call and ask)       [facility] Department [phone]       [facility] Pediatricians/Family Doctors:   [facility]: [phone]  [facility]: [phone]  [facility]: [phone]  [facility] Doctors:   [facility]: [phone]   [facility]: [phone]  [facility] Doctors:  [facility]: [phone]    Home Blood Pressure Monitoring for Patients   Your provider has recommended that you check your blood pressure (BP) at least once a week at home. If you do not have a blood pressure cuff at home, one will be provided for you. Contact your provider if you have not received your monitor within 1 week.   Helpful Tips for Accurate Home Blood Pressure Checks   Don't smoke, exercise, or drink caffeine 30 minutes before checking your BP  Use the restroom before checking your BP (a full bladder can raise your pressure)  Relax in a comfortable upright chair  Feet on the ground  Left arm resting comfortably on a flat surface at the level of your heart  Legs uncrossed  Back supported  Sit quietly and don't talk  Place the cuff on your bare  arm  Adjust snuggly, so that only two fingertips can fit between your skin and the top of the cuff  Check 2 readings separated by at least one minute  Keep a log of your BP readings  For a visual, please reference this diagram: http://ccnc.care/bpdiagram  Provider Name: [facility]     Phone: [phone]  Zone 1: ALL CLEAR  Continue to monitor your symptoms:   BP reading is less than 140 (top number) or less than 90 (bottom number)   No right upper stomach pain  No headaches or seeing spots  No feeling nauseated or throwing up  No swelling in face and hands  Zone 2: CAUTION Call your doctor's office for any of the following:   BP reading is greater than 140 (top number) or greater than 90 (bottom number)   Stomach pain under your ribs in the middle or right side  Headaches or seeing spots  Feeling nauseated or throwing up  Swelling in face and hands  Zone 3: EMERGENCY  Seek immediate medical care if you have any of the following:   BP reading is greater than160 (top number) or greater than 110 (bottom number)  Severe headaches not improving with Tylenol  Serious difficulty catching your breath  Any worsening symptoms from Zone 2     Second Trimester of Pregnancy The second trimester is from week 14 through week 27 (months 4 through 6). The second trimester is often a  time when you feel your best. Your body has adjusted to being pregnant, and you begin to feel better physically. Usually, morning sickness has lessened or quit completely, you may have more energy, and you may have an increase in appetite. The second trimester is also a time when the fetus is growing rapidly. At the end of the sixth month, the fetus is about 9 inches long and weighs about 1 pounds. You will likely begin to feel the baby move (quickening) between 16 and 20 weeks of pregnancy. Body changes during your second trimester Your body continues to go through many changes during your  second trimester. The changes vary from woman to woman.  Your weight will continue to increase. You will notice your lower abdomen bulging out.  You may begin to get stretch marks on your hips, abdomen, and breasts.  You may develop headaches that can be relieved by medicines. The medicines should be approved by your health care provider.  You may urinate more often because the fetus is pressing on your bladder.  You may develop or continue to have heartburn as a result of your pregnancy.  You may develop constipation because certain hormones are causing the muscles that push waste through your intestines to slow down.  You may develop hemorrhoids or swollen, bulging veins (varicose veins).  You may have back pain. This is caused by: ? Weight gain. ? Pregnancy hormones that are relaxing the joints in your pelvis. ? A shift in weight and the muscles that support your balance.  Your breasts will continue to grow and they will continue to become tender.  Your gums may bleed and may be sensitive to brushing and flossing.  Dark spots or blotches (chloasma, mask of pregnancy) may develop on your face. This will likely fade after the baby is born.  A dark line from your belly button to the pubic area (linea nigra) may appear. This will likely fade after the baby is born.  You may have changes in your hair. These can include thickening of your hair, rapid growth, and changes in texture. Some women also have hair loss during or after pregnancy, or hair that feels dry or thin. Your hair will most likely return to normal after your baby is born.  What to expect at prenatal visits During a routine prenatal visit:  You will be weighed to make sure you and the fetus are growing normally.  Your blood pressure will be taken.  Your abdomen will be measured to track your baby's growth.  The fetal heartbeat will be listened to.  Any test results from the previous visit will be  discussed.  Your health care provider may ask you:  How you are feeling.  If you are feeling the baby move.  If you have had any abnormal symptoms, such as leaking fluid, bleeding, severe headaches, or abdominal cramping.  If you are using any tobacco products, including cigarettes, chewing tobacco, and electronic cigarettes.  If you have any questions.  Other tests that may be performed during your second trimester include:  Blood tests that check for: ? Low iron levels (anemia). ? High blood sugar that affects pregnant women (gestational diabetes) between 63 and 28 weeks. ? Rh antibodies. This is to check for a protein on red blood cells (Rh factor).  Urine tests to check for infections, diabetes, or protein in the urine.  An ultrasound to confirm the proper growth and development of the baby.  An amniocentesis to check for  possible genetic problems.  Fetal screens for spina bifida and Down syndrome.  HIV (human immunodeficiency virus) testing. Routine prenatal testing includes screening for HIV, unless you choose not to have this test.  Follow these instructions at home: Medicines  Follow your health care provider's instructions regarding medicine use. Specific medicines may be either safe or unsafe to take during pregnancy.  Take a prenatal vitamin that contains at least 600 micrograms (mcg) of folic acid.  If you develop constipation, try taking a stool softener if your health care provider approves. Eating and drinking  Eat a balanced diet that includes fresh fruits and vegetables, whole grains, good sources of protein such as meat, eggs, or tofu, and low-fat dairy. Your health care provider will help you determine the amount of weight gain that is right for you.  Avoid raw meat and uncooked cheese. These carry germs that can cause birth defects in the baby.  If you have low calcium intake from food, talk to your health care provider about whether you should take a  daily calcium supplement.  Limit foods that are high in fat and processed sugars, such as fried and sweet foods.  To prevent constipation: ? Drink enough fluid to keep your urine clear or pale yellow. ? Eat foods that are high in fiber, such as fresh fruits and vegetables, whole grains, and beans. Activity  Exercise only as directed by your health care provider. Most women can continue their usual exercise routine during pregnancy. Try to exercise for 30 minutes at least 5 days a week. Stop exercising if you experience uterine contractions.  Avoid heavy lifting, wear low heel shoes, and practice good posture.  A sexual relationship may be continued unless your health care provider directs you otherwise. Relieving pain and discomfort  Wear a good support bra to prevent discomfort from breast tenderness.  Take warm sitz baths to soothe any pain or discomfort caused by hemorrhoids. Use hemorrhoid cream if your health care provider approves.  Rest with your legs elevated if you have leg cramps or low back pain.  If you develop varicose veins, wear support hose. Elevate your feet for 15 minutes, 3-4 times a day. Limit salt in your diet. Prenatal Care  Write down your questions. Take them to your prenatal visits.  Keep all your prenatal visits as told by your health care provider. This is important. Safety  Wear your seat belt at all times when driving.  Make a list of emergency phone numbers, including numbers for family, friends, the hospital, and police and fire departments. General instructions  Ask your health care provider for a referral to a local prenatal education class. Begin classes no later than the beginning of month 6 of your pregnancy.  Ask for help if you have counseling or nutritional needs during pregnancy. Your health care provider can offer advice or refer you to specialists for help with various needs.  Do not use hot tubs, steam rooms, or saunas.  Do not  douche or use tampons or scented sanitary pads.  Do not cross your legs for long periods of time.  Avoid cat litter boxes and soil used by cats. These carry germs that can cause birth defects in the baby and possibly loss of the fetus by miscarriage or stillbirth.  Avoid all smoking, herbs, alcohol, and unprescribed drugs. Chemicals in these products can affect the formation and growth of the baby.  Do not use any products that contain nicotine or tobacco, such as cigarettes and  e-cigarettes. If you need help quitting, ask your health care provider.  Visit your dentist if you have not gone yet during your pregnancy. Use a soft toothbrush to brush your teeth and be gentle when you floss. Contact a health care provider if:  You have dizziness.  You have mild pelvic cramps, pelvic pressure, or nagging pain in the abdominal area.  You have persistent nausea, vomiting, or diarrhea.  You have a bad smelling vaginal discharge.  You have pain when you urinate. Get help right away if:  You have a fever.  You are leaking fluid from your vagina.  You have spotting or bleeding from your vagina.  You have severe abdominal cramping or pain.  You have rapid weight gain or weight loss.  You have shortness of breath with chest pain.  You notice sudden or extreme swelling of your face, hands, ankles, feet, or legs.  You have not felt your baby move in over an hour.  You have severe headaches that do not go away when you take medicine.  You have vision changes. Summary  The second trimester is from week 14 through week 27 (months 4 through 6). It is also a time when the fetus is growing rapidly.  Your body goes through many changes during pregnancy. The changes vary from woman to woman.  Avoid all smoking, herbs, alcohol, and unprescribed drugs. These chemicals affect the formation and growth your baby.  Do not use any tobacco products, such as cigarettes, chewing tobacco, and  e-cigarettes. If you need help quitting, ask your health care provider.  Contact your health care provider if you have any questions. Keep all prenatal visits as told by your health care provider. This is important. This information is not intended to replace advice given to you by your health care provider. Make sure you discuss any questions you have with your health care provider. Document Released: 06/05/2001 Document Revised: 11/17/2015 Document Reviewed: 08/12/2012 Elsevier Interactive Patient Education  2017 [place] FLU! Because you are pregnant, we at [facility], along with the Centers for Disease Control (CDC), recommend that you receive the flu vaccine to protect yourself and your baby from the flu. The flu is more likely to cause severe illness in pregnant women than in women of reproductive age who are not pregnant. Changes in the immune system, heart, and lungs during pregnancy make pregnant women (and women up to two weeks postpartum) more prone to severe illness from flu, including illness resulting in hospitalization. Flu also may be harmful for a pregnant womans developing baby. A common flu symptom is fever, which may be associated with neural tube defects and other adverse outcomes for a developing baby. Getting vaccinated can also help protect a baby after birth from flu. (Mom passes antibodies onto the developing baby during her pregnancy.)  A Flu Vaccine is the Best Protection Against Flu Getting a flu vaccine is the first and most important step in protecting against flu. Pregnant women should get a flu shot and not the live attenuated influenza vaccine (LAIV), also known as nasal spray flu vaccine. Flu vaccines given during pregnancy help protect both the mother and her baby from flu. Vaccination has been shown to reduce the risk of flu-associated acute respiratory infection in pregnant women by up to one-half. A 2018 study showed  that getting a flu shot reduced a pregnant womans risk of being hospitalized with flu by an average of 40 percent.  Pregnant women who get a flu vaccine are also helping to protect their babies from flu illness for the first several months after their birth, when they are too young to get vaccinated.   A Long Record of Safety for Flu Shots in Pregnant Women Flu shots have been given to millions of pregnant women over many years with a good safety record. There is a lot of evidence that flu vaccines can be given safely during pregnancy; though these data are limited for the first trimester. The CDC recommends that pregnant women get vaccinated during any trimester of their pregnancy. It is very important for pregnant women to get the flu shot.   Other Preventive Actions In addition to getting a flu shot, pregnant women should take the same everyday preventive actions the CDC recommends of everyone, including covering coughs, washing hands often, and avoiding people who are sick.  Symptoms and Treatment If you get sick with flu symptoms call your doctor right away. There are antiviral drugs that can treat flu illness and prevent serious flu complications. The CDC recommends prompt treatment for people who have influenza infection or suspected influenza infection and who are at high risk of serious flu complications, such as people with asthma, diabetes (including gestational diabetes), or heart disease. Early treatment of influenza in hospitalized pregnant women has been shown to reduce the length of the hospital stay.  Symptoms Flu symptoms include fever, cough, sore throat, runny or stuffy nose, body aches, headache, chills and fatigue. Some people may also have vomiting and diarrhea. People may be infected with the flu and have respiratory symptoms without a fever.  Early Treatment is Important for Pregnant Women Treatment should begin as soon as possible because antiviral drugs work best when  started early (within 48 hours after symptoms start). Antiviral drugs can make your flu illness milder and make you feel better faster. They may also prevent serious health problems that can result from flu illness. Oral oseltamivir (Tamiflu) is the preferred treatment for pregnant women because it has the most studies available to suggest that it is safe and beneficial. Antiviral drugs require a prescription from your provider. Having a fever caused by flu infection or other infections early in pregnancy may be linked to birth defects in a baby. In addition to taking antiviral drugs, pregnant women who get a fever should treat their fever with Tylenol (acetaminophen) and contact their provider immediately.  When to [place] If you are pregnant and have any of these signs, seek care immediately:  Difficulty breathing or shortness of breath  Pain or pressure in the chest or abdomen  Sudden dizziness  Confusion  Severe or persistent vomiting  High fever that is not responding to Tylenol (or store brand equivalent)  Decreased or no movement of your baby  [email or web address]

## 2019-05-27 NOTE — Progress Notes (Signed)
   LOW-RISK PREGNANCY VISIT Patient name: Molly Zimmerman MRN [id number]  Date of birth: 1995-12-23 Chief Complaint:   Routine Prenatal Visit  History of Present Illness:   Molly Zimmerman is a 23 y.o. G32P2002 female at [redacted]w[redacted]d with an Estimated Date of Delivery: 09/02/19 being seen today for ongoing management of a low-risk pregnancy.  Today she reports no complaints. Contractions: Not present. Vag. Bleeding: None.  Movement: Present. denies leaking of fluid. Feels very strongly about having ppBTL. Will have her sign papers today and then continue to discuss during the preg. Understands the purpose and rationale of using a LARC.  Review of Systems:   Pertinent items are noted in HPI Denies abnormal vaginal discharge w/ itching/odor/irritation, headaches, visual changes, shortness of breath, chest pain, abdominal pain, severe nausea/vomiting, or problems with urination or bowel movements unless otherwise stated above. Pertinent History Reviewed:  Reviewed past medical,surgical, social, obstetrical and family history.  Reviewed problem list, medications and allergies. Physical Assessment:   Vitals:   05/27/19 0956  BP: 126/73  Pulse: 96  Weight: 291 lb (132 kg)  Body mass index is 49.95 kg/m.        Physical Examination:   General appearance: Well appearing, and in no distress  Mental status: Alert, oriented to person, place, and time  Skin: Warm & dry  Cardiovascular: Normal heart rate noted  Respiratory: Normal respiratory effort, no distress  Abdomen: Soft, gravid, nontender  Pelvic: Cervical exam deferred         Extremities: Edema: None  Fetal Status: Fetal Heart Rate (bpm): 155 Fundal Height: 27 cm Movement: Present    Results for orders placed or performed in visit on 05/27/19 (from the past 24 hour(s))  POC Urinalysis Dipstick OB   Collection Time: 05/27/19  9:56 AM  Result Value Ref Range   Color, UA     Clarity, UA     Glucose, UA Negative Negative   Bilirubin, UA     Ketones, UA neg    Spec Grav, UA     Blood, UA neg    pH, UA     POC,PROTEIN,UA Negative Negative, Trace, Small (1+), Moderate (2+), Large (3+), 4+   Urobilinogen, UA     Nitrite, UA neg    Leukocytes, UA Negative Negative   Appearance     Odor      Assessment & Plan:  1) Low-risk pregnancy G3P2002 at [redacted]w[redacted]d with an Estimated Date of Delivery: 09/02/19   2) Hx substance abuse, THC use in first days after +UPT, then hasn't used anything since  3) Desires sterilization, understands concerns of many years of childbearing left, wants to sign papers today and will continue to discuss   Meds: No orders of the defined types were placed in this encounter.  Labs/procedures today: PN2, Tdap  Plan:  Continue routine obstetrical care- prefers in person  Reviewed: Preterm labor symptoms and general obstetric precautions including but not limited to vaginal bleeding, contractions, leaking of fluid and fetal movement were reviewed in detail with the patient.  All questions were answered. Has home bp cuff. Check bp weekly, let us know if >140/90.   Follow-up: Return in about 4 weeks (around 06/24/2019) for [place], in person, Sign BTL consent today.  Orders Placed This Encounter  Procedures  . POC Urinalysis Dipstick OB   Myrtis Ser CNM 05/27/2019 10:30 AM

## 2019-05-28 LAB — CBC
Hematocrit: 36.6 % (ref 34.0–46.6)
Hemoglobin: 12.2 g/dL (ref 11.1–15.9)
MCH: 28.8 pg (ref 26.6–33.0)
MCHC: 33.3 g/dL (ref 31.5–35.7)
MCV: 86 fL (ref 79–97)
Platelets: 169 10*3/uL (ref 150–450)
RBC: 4.24 x10E6/uL (ref 3.77–5.28)
RDW: 12.3 % (ref 11.7–15.4)
WBC: 7.8 10*3/uL (ref 3.4–10.8)

## 2019-05-28 LAB — GLUCOSE TOLERANCE, 2 HOURS W/ 1HR
Glucose, 1 hour: 130 mg/dL (ref 65–179)
Glucose, 2 hour: 114 mg/dL (ref 65–152)
Glucose, Fasting: 78 mg/dL (ref 65–91)

## 2019-05-28 LAB — ANTIBODY SCREEN: Antibody Screen: NEGATIVE

## 2019-05-28 LAB — RPR: RPR Ser Ql: NONREACTIVE

## 2019-05-28 LAB — HIV ANTIBODY (ROUTINE TESTING W REFLEX): HIV Screen 4th Generation wRfx: NONREACTIVE

## 2019-06-24 ENCOUNTER — Other Ambulatory Visit: Payer: Self-pay

## 2019-06-24 ENCOUNTER — Ambulatory Visit (INDEPENDENT_AMBULATORY_CARE_PROVIDER_SITE_OTHER): Payer: Medicaid Other | Admitting: Advanced Practice Midwife

## 2019-06-24 ENCOUNTER — Encounter: Payer: Self-pay | Admitting: Advanced Practice Midwife

## 2019-06-24 VITALS — BP 117/70 | HR 101 | Wt 292.0 lb

## 2019-06-24 DIAGNOSIS — Z331 Pregnant state, incidental: Secondary | ICD-10-CM

## 2019-06-24 DIAGNOSIS — O99323 Drug use complicating pregnancy, third trimester: Secondary | ICD-10-CM

## 2019-06-24 DIAGNOSIS — F1910 Other psychoactive substance abuse, uncomplicated: Secondary | ICD-10-CM

## 2019-06-24 DIAGNOSIS — Z302 Encounter for sterilization: Secondary | ICD-10-CM

## 2019-06-24 DIAGNOSIS — F1290 Cannabis use, unspecified, uncomplicated: Secondary | ICD-10-CM

## 2019-06-24 DIAGNOSIS — Z3483 Encounter for supervision of other normal pregnancy, third trimester: Secondary | ICD-10-CM

## 2019-06-24 DIAGNOSIS — Z1389 Encounter for screening for other disorder: Secondary | ICD-10-CM

## 2019-06-24 DIAGNOSIS — Z3A30 30 weeks gestation of pregnancy: Secondary | ICD-10-CM

## 2019-06-24 LAB — POCT URINALYSIS DIPSTICK OB
Blood, UA: NEGATIVE
Glucose, UA: NEGATIVE
Ketones, UA: NEGATIVE
Leukocytes, UA: NEGATIVE
Nitrite, UA: NEGATIVE

## 2019-06-24 MED ORDER — CITRANATAL RX 27-1 MG PO TABS: 1.0000 | ORAL_TABLET | Freq: Every day | ORAL | 6 refills | Status: DC

## 2019-06-24 NOTE — Progress Notes (Signed)
   LOW-RISK PREGNANCY VISIT Patient name: Molly Zimmerman MRN [id number]  Date of birth: 04/23/1996 Chief Complaint:   Routine Prenatal Visit  History of Present Illness:   Molly Zimmerman is a 23 y.o. G79P2002 female at [redacted]w[redacted]d with an Estimated Date of Delivery: 09/02/19 being seen today for ongoing management of a low-risk pregnancy.  Today she reports intermittent BH ctx. Contractions: Not present.  .  Movement: Present. denies leaking of fluid. Requesting Citranatal PNV Review of Systems:   Pertinent items are noted in HPI Denies abnormal vaginal discharge w/ itching/odor/irritation, headaches, visual changes, shortness of breath, chest pain, abdominal pain, severe nausea/vomiting, or problems with urination or bowel movements unless otherwise stated above. Pertinent History Reviewed:  Reviewed past medical,surgical, social, obstetrical and family history.  Reviewed problem list, medications and allergies. Physical Assessment:   Vitals:   06/24/19 0935  BP: 117/70  Pulse: (!) 101  Weight: 292 lb (132.5 kg)  Body mass index is 50.12 kg/m.        Physical Examination:   General appearance: Well appearing, and in no distress  Mental status: Alert, oriented to person, place, and time  Skin: Warm & dry  Cardiovascular: Normal heart rate noted  Respiratory: Normal respiratory effort, no distress  Abdomen: Soft, gravid, nontender  Pelvic: Cervical exam deferred         Extremities: Edema: None  Fetal Status: Fetal Heart Rate (bpm): 144 Fundal Height: 30 cm Movement: Present    Results for orders placed or performed in visit on 06/24/19 (from the past 24 hour(s))  POC Urinalysis Dipstick OB   Collection Time: 06/24/19  9:44 AM  Result Value Ref Range   Color, UA     Clarity, UA     Glucose, UA Negative Negative   Bilirubin, UA     Ketones, UA neg    Spec Grav, UA     Blood, UA neg    pH, UA     POC,PROTEIN,UA Trace Negative, Trace, Small (1+), Moderate (2+), Large (3+), 4+   Urobilinogen, UA     Nitrite, UA neg    Leukocytes, UA Negative Negative   Appearance     Odor      Assessment & Plan:  1) Low-risk pregnancy G3P2002 at [redacted]w[redacted]d with an Estimated Date of Delivery: 09/02/19   2) Desires sterilization, didn't sign papers last week- will do this week; continue to discuss LARCs   Meds:  Meds ordered this encounter  Medications  . Prenat w/o [id number] (CITRANATAL RX) 27-1 MG TABS    Sig: Take 1 tablet by mouth daily.    Dispense:  30 tablet    Refill:  6    Order Specific Question:   Supervising Provider    Answer:   Marjory Lies   Labs/procedures today: none  Plan:  Continue routine obstetrical care- prefers in-person   Reviewed: Preterm labor symptoms and general obstetric precautions including but not limited to vaginal bleeding, contractions, leaking of fluid and fetal movement were reviewed in detail with the patient.  All questions were answered. Unsure if has home bp cuff. Check bp weekly, let us know if >140/90.   Follow-up: Return in about 2 weeks (around 07/08/2019) for LROB, in person, Sign BTL consent today.  Orders Placed This Encounter  Procedures  . POC Urinalysis Dipstick OB   Myrtis Ser [facility] 06/24/2019 9:59 AM

## 2019-06-24 NOTE — Patient Instructions (Signed)
Wayne Sever, I greatly value your feedback.  If you receive a survey following your visit with [place] today, we appreciate you taking the time to fill it out.  Thanks, Derrill Memo CNM  [place]!!! It is now [facility] [place] at [facility] ([place], [place] [address]) Entrance located off of [address] parking    Go to [email or web address] to register for FREE online childbirth classes   Call the office [phone]) or go to [facility] if:  You begin to have strong, frequent contractions  Your water breaks.  Sometimes it is a big gush of fluid, sometimes it is just a trickle that keeps getting your panties wet or running down your legs  You have vaginal bleeding.  It is normal to have a small amount of spotting if your cervix was checked.   You don't feel your baby moving like normal.  If you don't, get you something to eat and drink and lay down and focus on feeling your baby move.  You should feel at least 10 movements in 2 hours.  If you don't, you should call the office or go to [facility].    Tdap Vaccine  It is recommended that you get the Tdap vaccine during the third trimester of EACH pregnancy to help protect your baby from getting pertussis (whooping cough)  27-36 weeks is the BEST time to do this so that you can pass the protection on to your baby. During pregnancy is better than after pregnancy, but if you are unable to get it during pregnancy it will be offered at the hospital.   You can get this vaccine with [place], at the health department, your family doctor, or some local pharmacies  Everyone who will be around your baby should also be up-to-date on their vaccines before the baby comes. Adults (who are not pregnant) only need 1 dose of Tdap during adulthood.   [place] Pediatricians/Family Doctors:  [facility] [phone]                  [place] [phone] (usually not accepting new patients unless you have family there already, you are always welcome to call and ask)       [facility] Department [phone]       [facility] Pediatricians/Family Doctors:   [facility]: [phone]  [facility]: [phone]  [facility]: [place] Doctors:   [facility]: [facility]: [place]:  [place]: [phone]   Home Blood Pressure Monitoring for Patients   Your provider has recommended that you check your blood pressure (BP) at least once a week at home. If you do not have a blood pressure cuff at home, one will be provided for you. Contact your provider if you have not received your monitor within 1 week.   Helpful Tips for Accurate Home Blood Pressure Checks  . Don't smoke, exercise, or drink caffeine 30 minutes before checking your BP . Use the restroom before checking your BP (a full bladder can raise your pressure) . Relax in a comfortable upright chair . Feet on the ground . Left arm resting comfortably on a flat surface at the level of your heart . Legs uncrossed . Back supported . Sit quietly and don't talk .  Place the cuff on your bare arm . Adjust snuggly, so that only two fingertips can fit between your skin and the top of the cuff . Check 2 readings separated by at least one minute . Keep a log of your BP readings . For a visual, please reference this diagram: http://ccnc.care/bpdiagram  Provider Name: [facility]     Phone: [phone]  Zone 1: ALL CLEAR  Continue to monitor your symptoms:  . BP reading is less than 140 (top number) or less than 90 (bottom number)  . No right upper stomach pain . No headaches or seeing spots . No feeling nauseated or throwing up . No swelling in face and  hands  Zone 2: CAUTION Call your doctor's office for any of the following:  . BP reading is greater than 140 (top number) or greater than 90 (bottom number)  . Stomach pain under your ribs in the middle or right side . Headaches or seeing spots . Feeling nauseated or throwing up . Swelling in face and hands  Zone 3: EMERGENCY  Seek immediate medical care if you have any of the following:  . BP reading is greater than160 (top number) or greater than 110 (bottom number) . Severe headaches not improving with Tylenol . Serious difficulty catching your breath . Any worsening symptoms from Zone 2   Third Trimester of Pregnancy The third trimester is from week 29 through week 42, months 7 through 9. The third trimester is a time when the fetus is growing rapidly. At the end of the ninth month, the fetus is about 20 inches in length and weighs 6-10 pounds.  BODY CHANGES Your body goes through many changes during pregnancy. The changes vary from woman to woman.   Your weight will continue to increase. You can expect to gain 25-35 pounds (11-16 kg) by the end of the pregnancy.  You may begin to get stretch marks on your hips, abdomen, and breasts.  You may urinate more often because the fetus is moving lower into your pelvis and pressing on your bladder.  You may develop or continue to have heartburn as a result of your pregnancy.  You may develop constipation because certain hormones are causing the muscles that push waste through your intestines to slow down.  You may develop hemorrhoids or swollen, bulging veins (varicose veins).  You may have pelvic pain because of the weight gain and pregnancy hormones relaxing your joints between the bones in your pelvis. Backaches may result from overexertion of the muscles supporting your posture.  You may have changes in your hair. These can include thickening of your hair, rapid growth, and changes in texture. Some women also have hair loss during  or after pregnancy, or hair that feels dry or thin. Your hair will most likely return to normal after your baby is born.  Your breasts will continue to grow and be tender. A yellow discharge may leak from your breasts called colostrum.  Your belly button may stick out.  You may feel short of breath because of your expanding uterus.  You may notice the fetus "dropping," or moving lower in your abdomen.  You may have a bloody mucus discharge. This usually occurs a few days to a week before labor begins.  Your cervix becomes thin and soft (effaced) near your due date. WHAT TO EXPECT AT YOUR PRENATAL EXAMS  You will have prenatal exams every 2 weeks until week 36. Then, you will have weekly prenatal exams. During  a routine prenatal visit:  You will be weighed to make sure you and the fetus are growing normally.  Your blood pressure is taken.  Your abdomen will be measured to track your baby's growth.  The fetal heartbeat will be listened to.  Any test results from the previous visit will be discussed.  You may have a cervical check near your due date to see if you have effaced. At around 36 weeks, your caregiver will check your cervix. At the same time, your caregiver will also perform a test on the secretions of the vaginal tissue. This test is to determine if a type of bacteria, Group B streptococcus, is present. Your caregiver will explain this further. Your caregiver may ask you:  What your birth plan is.  How you are feeling.  If you are feeling the baby move.  If you have had any abnormal symptoms, such as leaking fluid, bleeding, severe headaches, or abdominal cramping.  If you have any questions. Other tests or screenings that may be performed during your third trimester include:  Blood tests that check for low iron levels (anemia).  Fetal testing to check the health, activity level, and growth of the fetus. Testing is done if you have certain medical conditions or if  there are problems during the pregnancy. FALSE LABOR You may feel small, irregular contractions that eventually go away. These are called Braxton Hicks contractions, or false labor. Contractions may last for hours, days, or even weeks before true labor sets in. If contractions come at regular intervals, intensify, or become painful, it is best to be seen by your caregiver.  SIGNS OF LABOR   Menstrual-like cramps.  Contractions that are 5 minutes apart or less.  Contractions that start on the top of the uterus and spread down to the lower abdomen and back.  A sense of increased pelvic pressure or back pain.  A watery or bloody mucus discharge that comes from the vagina. If you have any of these signs before the 37th week of pregnancy, call your caregiver right away. You need to go to the hospital to get checked immediately. HOME CARE INSTRUCTIONS   Avoid all smoking, herbs, alcohol, and unprescribed drugs. These chemicals affect the formation and growth of the baby.  Follow your caregiver's instructions regarding medicine use. There are medicines that are either safe or unsafe to take during pregnancy.  Exercise only as directed by your caregiver. Experiencing uterine cramps is a good sign to stop exercising.  Continue to eat regular, healthy meals.  Wear a good support bra for breast tenderness.  Do not use hot tubs, steam rooms, or saunas.  Wear your seat belt at all times when driving.  Avoid raw meat, uncooked cheese, cat litter boxes, and soil used by cats. These carry germs that can cause birth defects in the baby.  Take your prenatal vitamins.  Try taking a stool softener (if your caregiver approves) if you develop constipation. Eat more high-fiber foods, such as fresh vegetables or fruit and whole grains. Drink plenty of fluids to keep your urine clear or pale yellow.  Take warm sitz baths to soothe any pain or discomfort caused by hemorrhoids. Use hemorrhoid cream if your  caregiver approves.  If you develop varicose veins, wear support hose. Elevate your feet for 15 minutes, 3-4 times a day. Limit salt in your diet.  Avoid heavy lifting, wear low heal shoes, and practice good posture.  Rest a lot with your legs elevated if you  have leg cramps or low back pain.  Visit your dentist if you have not gone during your pregnancy. Use a soft toothbrush to brush your teeth and be gentle when you floss.  A sexual relationship may be continued unless your caregiver directs you otherwise.  Do not travel far distances unless it is absolutely necessary and only with the approval of your caregiver.  Take prenatal classes to understand, practice, and ask questions about the labor and delivery.  Make a trial run to the hospital.  Pack your hospital bag.  Prepare the baby's nursery.  Continue to go to all your prenatal visits as directed by your caregiver. SEEK MEDICAL CARE IF:  You are unsure if you are in labor or if your water has broken.  You have dizziness.  You have mild pelvic cramps, pelvic pressure, or nagging pain in your abdominal area.  You have persistent nausea, vomiting, or diarrhea.  You have a bad smelling vaginal discharge.  You have pain with urination. SEEK IMMEDIATE MEDICAL CARE IF:   You have a fever.  You are leaking fluid from your vagina.  You have spotting or bleeding from your vagina.  You have severe abdominal cramping or pain.  You have rapid weight loss or gain.  You have shortness of breath with chest pain.  You notice sudden or extreme swelling of your face, hands, ankles, feet, or legs.  You have not felt your baby move in over an hour.  You have severe headaches that do not go away with medicine.  You have vision changes. Document Released: 06/05/2001 Document Revised: 06/16/2013 Document Reviewed: 08/12/2012 [facility] Patient Information 2015 [place], [place]. This information is not intended to replace advice  given to you by your health care provider. Make sure you discuss any questions you have with your health care provider.  PROTECT YOURSELF & YOUR BABY FROM THE FLU! Because you are pregnant, we at [facility], along with the Centers for Disease Control (CDC), recommend that you receive the flu vaccine to protect yourself and your baby from the flu. The flu is more likely to cause severe illness in pregnant women than in women of reproductive age who are not pregnant. Changes in the immune system, heart, and lungs during pregnancy make pregnant women (and women up to two weeks postpartum) more prone to severe illness from flu, including illness resulting in hospitalization. Flu also may be harmful for a pregnant woman's developing baby. A common flu symptom is fever, which may be associated with neural tube defects and other adverse outcomes for a developing baby. Getting vaccinated can also help protect a baby after birth from flu. (Mom passes antibodies onto the developing baby during her pregnancy.)  A Flu Vaccine is the Best Protection Against Flu Getting a flu vaccine is the first and most important step in protecting against flu. Pregnant women should get a flu shot and not the live attenuated influenza vaccine (LAIV), also known as nasal spray flu vaccine. Flu vaccines given during pregnancy help protect both the mother and her baby from flu. Vaccination has been shown to reduce the risk of flu-associated acute respiratory infection in pregnant women by up to one-half. A 2018 study showed that getting a flu shot reduced a pregnant woman's risk of being hospitalized with flu by an average of 40 percent. Pregnant women who get a flu vaccine are also helping to protect their babies from flu illness for the first several months after their birth, when they are too  young to get vaccinated.   A Long Record of Safety for Flu Shots in Pregnant Women Flu shots have been given to millions of pregnant women over  many years with a good safety record. There is a lot of evidence that flu vaccines can be given safely during pregnancy; though these data are limited for the first trimester. The CDC recommends that pregnant women get vaccinated during any trimester of their pregnancy. It is very important for pregnant women to get the flu shot.   Other Preventive Actions In addition to getting a flu shot, pregnant women should take the same everyday preventive actions the CDC recommends of everyone, including covering coughs, washing hands often, and avoiding people who are sick.  Symptoms and Treatment If you get sick with flu symptoms call your doctor right away. There are antiviral drugs that can treat flu illness and prevent serious flu complications. The CDC recommends prompt treatment for people who have influenza infection or suspected influenza infection and who are at high risk of serious flu complications, such as people with asthma, diabetes (including gestational diabetes), or heart disease. Early treatment of influenza in hospitalized pregnant women has been shown to reduce the length of the hospital stay.  Symptoms Flu symptoms include fever, cough, sore throat, runny or stuffy nose, body aches, headache, chills and fatigue. Some people may also have vomiting and diarrhea. People may be infected with the flu and have respiratory symptoms without a fever.  Early Treatment is Important for Pregnant Women Treatment should begin as soon as possible because antiviral drugs work best when started early (within 48 hours after symptoms start). Antiviral drugs can make your flu illness milder and make you feel better faster. They may also prevent serious health problems that can result from flu illness. Oral oseltamivir (Tamiflu) is the preferred treatment for pregnant women because it has the most studies available to suggest that it is safe and beneficial. Antiviral drugs require a prescription from your  provider. Having a fever caused by flu infection or other infections early in pregnancy may be linked to birth defects in a baby. In addition to taking antiviral drugs, pregnant women who get a fever should treat their fever with Tylenol (acetaminophen) and contact their provider immediately.  When to [place] If you are pregnant and have any of these signs, seek care immediately:  Difficulty breathing or shortness of breath  Pain or pressure in the chest or abdomen  Sudden dizziness  Confusion  Severe or persistent vomiting  High fever that is not responding to Tylenol (or store brand equivalent)  Decreased or no movement of your baby  [email or web address]

## 2019-06-26 NOTE — L&D Delivery Note (Signed)
  Delivery Note Pt progressed to C/C/+2. After one push, at 12:31 AM a viable female was delivered via Vaginal, Spontaneous (Presentation: Middle Occiput Anterior).  APGAR: 9, 9; weight pending. After 1 minute, the cord was clamped and cut. 40 units of pitocin diluted in 1000cc LR was infused rapidly IV.  The placenta separated spontaneously and delivered via CCT and maternal pushing effort.  It was inspected and appears to be intact with a 3 VC.   Anesthesia: Epidural Episiotomy: None Lacerations: 1st degree Suture Repair:  Est. Blood Loss (mL): 100  Mom to postpartum.  Baby to Couplet care / Skin to Skin.  Scarlette Calico Cresenzo-Dishmon 09/01/2019, 1:33 AM

## 2019-07-08 ENCOUNTER — Other Ambulatory Visit: Payer: Self-pay

## 2019-07-08 ENCOUNTER — Ambulatory Visit (INDEPENDENT_AMBULATORY_CARE_PROVIDER_SITE_OTHER): Payer: Medicaid Other | Admitting: Advanced Practice Midwife

## 2019-07-08 ENCOUNTER — Other Ambulatory Visit (HOSPITAL_COMMUNITY)
Admission: RE | Admit: 2019-07-08 | Discharge: 2019-07-08 | Disposition: A | Discharge status: Home / Self Care | Payer: Medicaid Other | Source: Ambulatory Visit | Attending: Advanced Practice Midwife | Admitting: Advanced Practice Midwife

## 2019-07-08 VITALS — BP 129/75 | HR 99 | Wt 297.2 lb

## 2019-07-08 DIAGNOSIS — Z113 Encounter for screening for infections with a predominantly sexual mode of transmission: Secondary | ICD-10-CM | POA: Diagnosis not present

## 2019-07-08 DIAGNOSIS — Z3A32 32 weeks gestation of pregnancy: Secondary | ICD-10-CM

## 2019-07-08 DIAGNOSIS — Z331 Pregnant state, incidental: Secondary | ICD-10-CM

## 2019-07-08 DIAGNOSIS — Z1389 Encounter for screening for other disorder: Secondary | ICD-10-CM

## 2019-07-08 DIAGNOSIS — Z3483 Encounter for supervision of other normal pregnancy, third trimester: Secondary | ICD-10-CM

## 2019-07-08 DIAGNOSIS — Z23 Encounter for immunization: Secondary | ICD-10-CM

## 2019-07-08 LAB — POCT URINALYSIS DIPSTICK OB
Blood, UA: NEGATIVE
Glucose, UA: NEGATIVE
Ketones, UA: NEGATIVE
Leukocytes, UA: NEGATIVE
Nitrite, UA: NEGATIVE

## 2019-07-08 NOTE — Patient Instructions (Signed)
Molly Zimmerman, I greatly value your feedback.  If you receive a survey following your visit with [place] today, we appreciate you taking the time to fill it out.  Thanks, Philipp Deputy, CNM  [facility] HOSPITAL HAS MOVED!!! It is now [facility] ([address] [place], [place] [address]) Entrance located off of [address] Free 24/7 valet parking    Go to [email or web address] to register for FREE online childbirth classes   Call the office [phone]) or go to [facility] if:  You begin to have strong, frequent contractions  Your water breaks.  Sometimes it is a big gush of fluid, sometimes it is just a trickle that keeps getting your panties wet or running down your legs  You have vaginal bleeding.  It is normal to have a small amount of spotting if your cervix was checked.   You don't feel your baby moving like normal.  If you don't, get you something to eat and drink and lay down and focus on feeling your baby move.  You should feel at least 10 movements in 2 hours.  If you don't, you should call the office or go to [facility].    Tdap Vaccine  It is recommended that you get the Tdap vaccine during the third trimester of EACH pregnancy to help protect your baby from getting pertussis (whooping cough)  27-36 weeks is the BEST time to do this so that you can pass the protection on to your baby. During pregnancy is better than after pregnancy, but if you are unable to get it during pregnancy it will be offered at the hospital.   You can get this vaccine with [place], at the health department, your family doctor, or some local pharmacies  Everyone who will be around your baby should also be up-to-date on their vaccines before the baby comes. Adults (who are not pregnant) only need 1 dose of Tdap during adulthood.   [place] Pediatricians/Family Doctors:  [facility] [phone]            [facility] [phone]                  [facility] [phone] (usually not accepting new patients unless you have family there already, you are always welcome to call and ask)       [facility] [phone]       [facility] Pediatricians/Family Doctors:   [facility]: [phone]  [facility]: [phone]  [facility]: [phone]  [facility] Doctors:   [facility]: [phone]   [facility]: [phone]  [facility] Doctors:  [facility]: [phone]   Home Blood Pressure Monitoring for Patients   Your provider has recommended that you check your blood pressure (BP) at least once a week at home. If you do not have a blood pressure cuff at home, one will be provided for you. Contact your provider if you have not received your monitor within 1 week.   Helpful Tips for Accurate Home Blood Pressure Checks  . Don't smoke, exercise, or drink caffeine 30 minutes before checking your BP . Use the restroom before checking your BP (a full bladder can raise your pressure) . Relax in a comfortable upright chair . Feet on the ground . Left arm resting comfortably on a flat surface at the level of your heart . Legs uncrossed . Back supported . Sit quietly and don't talk .  Place the cuff on your bare arm . Adjust snuggly, so that only two fingertips can fit between your skin and the top of the cuff . Check 2 readings separated by at least one minute . Keep a log of your BP readings . For a visual, please reference this diagram: http://ccnc.care/bpdiagram  Provider Name: [facility]     Phone: [phone]  Zone 1: ALL CLEAR  Continue to monitor your symptoms:  . BP reading is less than 140 (top number) or less than 90 (bottom number)  . No right upper stomach pain . No headaches or seeing spots . No feeling nauseated or throwing up . No swelling in face and  hands  Zone 2: CAUTION Call your doctor's office for any of the following:  . BP reading is greater than 140 (top number) or greater than 90 (bottom number)  . Stomach pain under your ribs in the middle or right side . Headaches or seeing spots . Feeling nauseated or throwing up . Swelling in face and hands  Zone 3: EMERGENCY  Seek immediate medical care if you have any of the following:  . BP reading is greater than160 (top number) or greater than 110 (bottom number) . Severe headaches not improving with Tylenol . Serious difficulty catching your breath . Any worsening symptoms from Zone 2   Third Trimester of Pregnancy The third trimester is from week 29 through week 42, months 7 through 9. The third trimester is a time when the fetus is growing rapidly. At the end of the ninth month, the fetus is about 20 inches in length and weighs 6-10 pounds.  BODY CHANGES Your body goes through many changes during pregnancy. The changes vary from woman to woman.   Your weight will continue to increase. You can expect to gain 25-35 pounds (11-16 kg) by the end of the pregnancy.  You may begin to get stretch marks on your hips, abdomen, and breasts.  You may urinate more often because the fetus is moving lower into your pelvis and pressing on your bladder.  You may develop or continue to have heartburn as a result of your pregnancy.  You may develop constipation because certain hormones are causing the muscles that push waste through your intestines to slow down.  You may develop hemorrhoids or swollen, bulging veins (varicose veins).  You may have pelvic pain because of the weight gain and pregnancy hormones relaxing your joints between the bones in your pelvis. Backaches may result from overexertion of the muscles supporting your posture.  You may have changes in your hair. These can include thickening of your hair, rapid growth, and changes in texture. Some women also have hair loss during  or after pregnancy, or hair that feels dry or thin. Your hair will most likely return to normal after your baby is born.  Your breasts will continue to grow and be tender. A yellow discharge may leak from your breasts called colostrum.  Your belly button may stick out.  You may feel short of breath because of your expanding uterus.  You may notice the fetus "dropping," or moving lower in your abdomen.  You may have a bloody mucus discharge. This usually occurs a few days to a week before labor begins.  Your cervix becomes thin and soft (effaced) near your due date. WHAT TO EXPECT AT YOUR PRENATAL EXAMS  You will have prenatal exams every 2 weeks until week 36. Then, you will have weekly prenatal exams. During  a routine prenatal visit:  You will be weighed to make sure you and the fetus are growing normally.  Your blood pressure is taken.  Your abdomen will be measured to track your baby's growth.  The fetal heartbeat will be listened to.  Any test results from the previous visit will be discussed.  You may have a cervical check near your due date to see if you have effaced. At around 36 weeks, your caregiver will check your cervix. At the same time, your caregiver will also perform a test on the secretions of the vaginal tissue. This test is to determine if a type of bacteria, Group B streptococcus, is present. Your caregiver will explain this further. Your caregiver may ask you:  What your birth plan is.  How you are feeling.  If you are feeling the baby move.  If you have had any abnormal symptoms, such as leaking fluid, bleeding, severe headaches, or abdominal cramping.  If you have any questions. Other tests or screenings that may be performed during your third trimester include:  Blood tests that check for low iron levels (anemia).  Fetal testing to check the health, activity level, and growth of the fetus. Testing is done if you have certain medical conditions or if  there are problems during the pregnancy. FALSE LABOR You may feel small, irregular contractions that eventually go away. These are called Braxton Hicks contractions, or false labor. Contractions may last for hours, days, or even weeks before true labor sets in. If contractions come at regular intervals, intensify, or become painful, it is best to be seen by your caregiver.  SIGNS OF LABOR   Menstrual-like cramps.  Contractions that are 5 minutes apart or less.  Contractions that start on the top of the uterus and spread down to the lower abdomen and back.  A sense of increased pelvic pressure or back pain.  A watery or bloody mucus discharge that comes from the vagina. If you have any of these signs before the 37th week of pregnancy, call your caregiver right away. You need to go to the hospital to get checked immediately. HOME CARE INSTRUCTIONS   Avoid all smoking, herbs, alcohol, and unprescribed drugs. These chemicals affect the formation and growth of the baby.  Follow your caregiver's instructions regarding medicine use. There are medicines that are either safe or unsafe to take during pregnancy.  Exercise only as directed by your caregiver. Experiencing uterine cramps is a good sign to stop exercising.  Continue to eat regular, healthy meals.  Wear a good support bra for breast tenderness.  Do not use hot tubs, steam rooms, or saunas.  Wear your seat belt at all times when driving.  Avoid raw meat, uncooked cheese, cat litter boxes, and soil used by cats. These carry germs that can cause birth defects in the baby.  Take your prenatal vitamins.  Try taking a stool softener (if your caregiver approves) if you develop constipation. Eat more high-fiber foods, such as fresh vegetables or fruit and whole grains. Drink plenty of fluids to keep your urine clear or pale yellow.  Take warm sitz baths to soothe any pain or discomfort caused by hemorrhoids. Use hemorrhoid cream if your  caregiver approves.  If you develop varicose veins, wear support hose. Elevate your feet for 15 minutes, 3-4 times a day. Limit salt in your diet.  Avoid heavy lifting, wear low heal shoes, and practice good posture.  Rest a lot with your legs elevated if you  have leg cramps or low back pain.  Visit your dentist if you have not gone during your pregnancy. Use a soft toothbrush to brush your teeth and be gentle when you floss.  A sexual relationship may be continued unless your caregiver directs you otherwise.  Do not travel far distances unless it is absolutely necessary and only with the approval of your caregiver.  Take prenatal classes to understand, practice, and ask questions about the labor and delivery.  Make a trial run to the hospital.  Pack your hospital bag.  Prepare the baby's nursery.  Continue to go to all your prenatal visits as directed by your caregiver. SEEK MEDICAL CARE IF:  You are unsure if you are in labor or if your water has broken.  You have dizziness.  You have mild pelvic cramps, pelvic pressure, or nagging pain in your abdominal area.  You have persistent nausea, vomiting, or diarrhea.  You have a bad smelling vaginal discharge.  You have pain with urination. SEEK IMMEDIATE MEDICAL CARE IF:   You have a fever.  You are leaking fluid from your vagina.  You have spotting or bleeding from your vagina.  You have severe abdominal cramping or pain.  You have rapid weight loss or gain.  You have shortness of breath with chest pain.  You notice sudden or extreme swelling of your face, hands, ankles, feet, or legs.  You have not felt your baby move in over an hour.  You have severe headaches that do not go away with medicine.  You have vision changes. Document Released: 06/05/2001 Document Revised: 06/16/2013 Document Reviewed: 08/12/2012 [facility] Patient Information 2015 [place], [place]. This information is not intended to replace advice  given to you by your health care provider. Make sure you discuss any questions you have with your health care provider.  PROTECT YOURSELF & YOUR BABY FROM THE FLU! Because you are pregnant, we at [facility], along with the Centers for Disease Control (CDC), recommend that you receive the flu vaccine to protect yourself and your baby from the flu. The flu is more likely to cause severe illness in pregnant women than in women of reproductive age who are not pregnant. Changes in the immune system, heart, and lungs during pregnancy make pregnant women (and women up to two weeks postpartum) more prone to severe illness from flu, including illness resulting in hospitalization. Flu also may be harmful for a pregnant woman's developing baby. A common flu symptom is fever, which may be associated with neural tube defects and other adverse outcomes for a developing baby. Getting vaccinated can also help protect a baby after birth from flu. (Mom passes antibodies onto the developing baby during her pregnancy.)  A Flu Vaccine is the Best Protection Against Flu Getting a flu vaccine is the first and most important step in protecting against flu. Pregnant women should get a flu shot and not the live attenuated influenza vaccine (LAIV), also known as nasal spray flu vaccine. Flu vaccines given during pregnancy help protect both the mother and her baby from flu. Vaccination has been shown to reduce the risk of flu-associated acute respiratory infection in pregnant women by up to one-half. A 2018 study showed that getting a flu shot reduced a pregnant woman's risk of being hospitalized with flu by an average of 40 percent. Pregnant women who get a flu vaccine are also helping to protect their babies from flu illness for the first several months after their birth, when they are too  young to get vaccinated.   A Long Record of Safety for Flu Shots in Pregnant Women Flu shots have been given to millions of pregnant women over  many years with a good safety record. There is a lot of evidence that flu vaccines can be given safely during pregnancy; though these data are limited for the first trimester. The CDC recommends that pregnant women get vaccinated during any trimester of their pregnancy. It is very important for pregnant women to get the flu shot.   Other Preventive Actions In addition to getting a flu shot, pregnant women should take the same everyday preventive actions the CDC recommends of everyone, including covering coughs, washing hands often, and avoiding people who are sick.  Symptoms and Treatment If you get sick with flu symptoms call your doctor right away. There are antiviral drugs that can treat flu illness and prevent serious flu complications. The CDC recommends prompt treatment for people who have influenza infection or suspected influenza infection and who are at high risk of serious flu complications, such as people with asthma, diabetes (including gestational diabetes), or heart disease. Early treatment of influenza in hospitalized pregnant women has been shown to reduce the length of the hospital stay.  Symptoms Flu symptoms include fever, cough, sore throat, runny or stuffy nose, body aches, headache, chills and fatigue. Some people may also have vomiting and diarrhea. People may be infected with the flu and have respiratory symptoms without a fever.  Early Treatment is Important for Pregnant Women Treatment should begin as soon as possible because antiviral drugs work best when started early (within 48 hours after symptoms start). Antiviral drugs can make your flu illness milder and make you feel better faster. They may also prevent serious health problems that can result from flu illness. Oral oseltamivir (Tamiflu) is the preferred treatment for pregnant women because it has the most studies available to suggest that it is safe and beneficial. Antiviral drugs require a prescription from your  provider. Having a fever caused by flu infection or other infections early in pregnancy may be linked to birth defects in a baby. In addition to taking antiviral drugs, pregnant women who get a fever should treat their fever with Tylenol (acetaminophen) and contact their provider immediately.  When to [place] If you are pregnant and have any of these signs, seek care immediately:  Difficulty breathing or shortness of breath  Pain or pressure in the chest or abdomen  Sudden dizziness  Confusion  Severe or persistent vomiting  High fever that is not responding to Tylenol (or store brand equivalent)  Decreased or no movement of your baby  [email or web address]

## 2019-07-08 NOTE — Progress Notes (Signed)
   LOW-RISK PREGNANCY VISIT Patient name: Molly Zimmerman MRN [id number]  Date of birth: [birth year] Chief Complaint:   Routine Prenatal Visit  History of Present Illness:   Molly Zimmerman is a 24 y.o. G90P2002 female at [redacted]w[redacted]d with an Estimated Date of Delivery: 09/02/19 being seen today for ongoing management of a low-risk pregnancy.  Today she reports third trimester discomforts. Contractions: Irritability. Vag. Bleeding: None.  Movement: Present. denies leaking of fluid.  Anxiety over preterm delivery. Rev'd s/s of preterm labor and expressed encouragement that she's gotten this far. Reports intermittent vag odor. No itching or d/c noted. Review of Systems:   Pertinent items are noted in HPI Denies abnormal vaginal discharge w/ itching/odor/irritation, headaches, visual changes, shortness of breath, chest pain, abdominal pain, severe nausea/vomiting, or problems with urination or bowel movements unless otherwise stated above. Pertinent History Reviewed:  Reviewed past medical,surgical, social, obstetrical and family history.  Reviewed problem list, medications and allergies. Physical Assessment:   Vitals:   07/08/19 0920  BP: 129/75  Pulse: 99  Weight: 297 lb 3.2 oz (134.8 kg)  Body mass index is 51.01 kg/m.        Physical Examination:   General appearance: Well appearing, and in no distress  Mental status: Alert, oriented to person, place, and time  Skin: Warm & dry  Cardiovascular: Normal heart rate noted  Respiratory: Normal respiratory effort, no distress  Abdomen: Soft, gravid, nontender  Pelvic: Cervical exam deferred         Extremities: Edema: Trace  Fetal Status: Fetal Heart Rate (bpm): 150 Fundal Height: 32 cm Movement: Present    Results for orders placed or performed in visit on 07/08/19 (from the past 24 hour(s))  POC Urinalysis Dipstick OB   Collection Time: 07/08/19  9:51 AM  Result Value Ref Range   Color, UA     Clarity, UA     Glucose, UA Negative Negative   Bilirubin, UA     Ketones, UA neg    Spec Grav, UA     Blood, UA neg    pH, UA     POC,PROTEIN,UA Trace Negative, Trace, Small (1+), Moderate (2+), Large (3+), 4+   Urobilinogen, UA     Nitrite, UA neg    Leukocytes, UA Negative Negative   Appearance     Odor      Assessment & Plan:  1) Low-risk pregnancy G3P2002 at [redacted]w[redacted]d with an Estimated Date of Delivery: 09/02/19   2) Fears over preterm delivery, concerns allayed, rev'd PTL s/s  3) Vag odor, swab collected   Meds: No orders of the defined types were placed in this encounter.  Labs/procedures today: Tdap given  Plan:  Continue routine obstetrical care   Reviewed: Preterm labor symptoms and general obstetric precautions including but not limited to vaginal bleeding, contractions, leaking of fluid and fetal movement were reviewed in detail with the patient.  All questions were answered. Has home bp cuff.  Check bp weekly, let us know if >140/90.   Follow-up: Return in about 2 weeks (around 07/22/2019) for LROB, in person.  Orders Placed This Encounter  Procedures  . Tdap vaccine greater than or equal to 7yo IM  . POC Urinalysis Dipstick OB   Arabella Merles [facility] 07/08/2019 9:55 AM

## 2019-07-09 LAB — CERVICOVAGINAL ANCILLARY ONLY
Chlamydia: NEGATIVE
Comment: NEGATIVE
Comment: NEGATIVE
Comment: NORMAL
Neisseria Gonorrhea: NEGATIVE
Trichomonas: NEGATIVE

## 2019-07-20 ENCOUNTER — Encounter (HOSPITAL_COMMUNITY): Payer: Self-pay | Admitting: Obstetrics & Gynecology

## 2019-07-20 ENCOUNTER — Inpatient Hospital Stay (HOSPITAL_COMMUNITY)
Admission: AD | Admit: 2019-07-20 | Discharge: 2019-07-21 | Disposition: A | Discharge status: Home / Self Care | Payer: Medicaid Other | Source: Ambulatory Visit | Attending: Obstetrics & Gynecology | Admitting: Obstetrics & Gynecology

## 2019-07-20 ENCOUNTER — Other Ambulatory Visit: Payer: Self-pay

## 2019-07-20 DIAGNOSIS — R109 Unspecified abdominal pain: Secondary | ICD-10-CM | POA: Diagnosis present

## 2019-07-20 DIAGNOSIS — O479 False labor, unspecified: Secondary | ICD-10-CM

## 2019-07-20 DIAGNOSIS — Z3483 Encounter for supervision of other normal pregnancy, third trimester: Secondary | ICD-10-CM

## 2019-07-20 DIAGNOSIS — O4703 False labor before 37 completed weeks of gestation, third trimester: Secondary | ICD-10-CM | POA: Insufficient documentation

## 2019-07-20 DIAGNOSIS — Z302 Encounter for sterilization: Secondary | ICD-10-CM

## 2019-07-20 DIAGNOSIS — Z3A34 34 weeks gestation of pregnancy: Secondary | ICD-10-CM | POA: Insufficient documentation

## 2019-07-20 DIAGNOSIS — Z3689 Encounter for other specified antenatal screening: Secondary | ICD-10-CM

## 2019-07-20 LAB — WET PREP, GENITAL
Sperm: NONE SEEN
Trich, Wet Prep: NONE SEEN
Yeast Wet Prep HPF POC: NONE SEEN

## 2019-07-20 MED ORDER — ACETAMINOPHEN 500 MG PO TABS
1000.0000 mg | ORAL_TABLET | Freq: Once | ORAL | Status: AC
  Administered 2019-07-20: 1000 mg via ORAL
  Filled 2019-07-20: qty 2

## 2019-07-20 MED ORDER — LACTATED RINGERS IV BOLUS
1000.0000 mL | Freq: Once | INTRAVENOUS | Status: AC
  Administered 2019-07-20: 23:00:00 1000 mL via INTRAVENOUS

## 2019-07-20 NOTE — MAU Note (Signed)
PT SAYS ON SAT - FELT SHARP PAIN  IN LOWER BAD - STOPPED.  THEN SAT NIGHT - FELT PELVIC PRESSURE - ALSO STOPPED-   ON Sunday - THE SAME.    THEN TODAY - CRAMPING.- CONSTANTLY SINCE 4 PM.  BACK   STARTED HURTING AT 8 PM.   NO MEDS.Marland Kitchen  [facility]- CALLED  AT  821PM- TOLD TO COME IN.

## 2019-07-21 NOTE — Discharge Instructions (Signed)

## 2019-07-22 ENCOUNTER — Encounter: Payer: Medicaid Other | Admitting: Advanced Practice Midwife

## 2019-07-23 ENCOUNTER — Encounter: Payer: Self-pay | Admitting: Family Medicine

## 2019-07-23 ENCOUNTER — Other Ambulatory Visit: Payer: Self-pay

## 2019-07-23 ENCOUNTER — Ambulatory Visit (INDEPENDENT_AMBULATORY_CARE_PROVIDER_SITE_OTHER): Payer: Medicaid Other | Admitting: Family Medicine

## 2019-07-23 VITALS — BP 129/72 | HR 101 | Wt 302.6 lb

## 2019-07-23 DIAGNOSIS — Z1389 Encounter for screening for other disorder: Secondary | ICD-10-CM

## 2019-07-23 DIAGNOSIS — Z3A34 34 weeks gestation of pregnancy: Secondary | ICD-10-CM

## 2019-07-23 DIAGNOSIS — Z331 Pregnant state, incidental: Secondary | ICD-10-CM

## 2019-07-23 DIAGNOSIS — O99210 Obesity complicating pregnancy, unspecified trimester: Secondary | ICD-10-CM

## 2019-07-23 DIAGNOSIS — Z3483 Encounter for supervision of other normal pregnancy, third trimester: Secondary | ICD-10-CM

## 2019-07-23 DIAGNOSIS — O99213 Obesity complicating pregnancy, third trimester: Secondary | ICD-10-CM

## 2019-07-23 NOTE — Progress Notes (Signed)
    PRENATAL VISIT NOTE  Subjective:  Molly Zimmerman is a 24 y.o. G3P2002 at [redacted]w[redacted]d being seen today for ongoing prenatal care.  She is currently monitored for the following issues for this low-risk pregnancy and has Oppositional defiant behavior; Bipolar 1 disorder, mixed, moderate (HCC); Polysubstance abuse (HCC); Marijuana use; History of suicide attempt; Morbid obesity (HCC); Supervision of normal pregnancy; Request for sterilization; and Obesity affecting pregnancy, antepartum on their problem list.  Patient reports no complaints.  Contractions: Irregular. Vag. Bleeding: None.  Movement: Present. Denies leaking of fluid.   The following portions of the patient's history were reviewed and updated as appropriate: allergies, current medications, past family history, past medical history, past social history, past surgical history and problem list. Problem list updated.  Objective:   Vitals:   07/23/19 1411  BP: 129/72  Pulse: (!) 101  Weight: (!) 302 lb 9.6 oz (137.3 kg)    Fetal Status:     Movement: Present     General:  Alert, oriented and cooperative. Patient is in no acute distress.  Skin: Skin is warm and dry. No rash noted.   Cardiovascular: Normal heart rate noted  Respiratory: Normal respiratory effort, no problems with respiration noted  Abdomen: Soft, gravid, appropriate for gestational age.  Pain/Pressure: Present     Pelvic: Cervical exam deferred        Extremities: Normal range of motion.  Edema: Trace  Mental Status: Normal mood and affect. Normal behavior. Normal judgment and thought content.   Assessment and Plan:  Pregnancy: G3P2002 at [redacted]w[redacted]d  1. Encounter for supervision of other normal pregnancy in third trimester Up to date Next appt for 36wk labs which was explained Reviewed contraceptive plan  2. Screening for genitourinary condition - POC Urinalysis Dipstick OB  3. Pregnant state, incidental - POC Urinalysis Dipstick OB  4. Obesity affecting  pregnancy, antepartum TWG=34 lb 9.6 oz (15.7 kg) which is well above goal for her baseline weight   Preterm labor symptoms and general obstetric precautions including but not limited to vaginal bleeding, contractions, leaking of fluid and fetal movement were reviewed in detail with the patient. Please refer to After Visit Summary for other counseling recommendations.   Return in about 2 weeks (around 08/06/2019) for Routine prenatal care, 36wks, in person.  No future appointments.  Federico Flake, MD

## 2019-08-03 ENCOUNTER — Telehealth: Payer: Self-pay | Admitting: *Deleted

## 2019-08-03 ENCOUNTER — Encounter: Payer: Self-pay | Admitting: *Deleted

## 2019-08-03 NOTE — Telephone Encounter (Signed)
Pt states that she is noticing more discharge lately. Wanted to make sure this is ok.

## 2019-08-03 NOTE — Telephone Encounter (Signed)
Returned patient call.  VM not set up. Mychart message sent.

## 2019-08-05 ENCOUNTER — Other Ambulatory Visit: Payer: Self-pay

## 2019-08-05 ENCOUNTER — Inpatient Hospital Stay (HOSPITAL_COMMUNITY)
Admission: AD | Admit: 2019-08-05 | Discharge: 2019-08-05 | Disposition: A | Discharge status: Home / Self Care | Payer: Medicaid Other | Attending: Obstetrics and Gynecology | Admitting: Obstetrics and Gynecology

## 2019-08-05 ENCOUNTER — Encounter (HOSPITAL_COMMUNITY): Payer: Self-pay | Admitting: Obstetrics and Gynecology

## 2019-08-05 DIAGNOSIS — Z0371 Encounter for suspected problem with amniotic cavity and membrane ruled out: Secondary | ICD-10-CM | POA: Diagnosis not present

## 2019-08-05 DIAGNOSIS — Z3A36 36 weeks gestation of pregnancy: Secondary | ICD-10-CM | POA: Insufficient documentation

## 2019-08-05 DIAGNOSIS — O26893 Other specified pregnancy related conditions, third trimester: Secondary | ICD-10-CM | POA: Diagnosis not present

## 2019-08-05 DIAGNOSIS — N898 Other specified noninflammatory disorders of vagina: Secondary | ICD-10-CM | POA: Insufficient documentation

## 2019-08-05 LAB — POCT FERN TEST: POCT Fern Test: NEGATIVE

## 2019-08-05 NOTE — MAU Provider Note (Signed)
First Provider Initiated Contact with Patient 08/05/19 [phone]       S: Ms. Molly Zimmerman is a 24 y.o. G3P2002 at [redacted]w[redacted]d  who presents to MAU today complaining of leaking of fluid since 2 days ago. She denies vaginal bleeding. She denies contractions. She reports normal fetal movement.    O: BP 130/66 (BP Location: Right Arm)   Pulse (!) 115   Resp 19   Ht 5\' 4"  (1.626 m)   Wt (!) 139.5 kg   LMP 11/26/2018 (Exact Date)   SpO2 100% Comment: room air  BMI 52.80 kg/m  GENERAL: Well-developed, well-nourished female in no acute distress.  HEAD: Normocephalic, atraumatic.  CHEST: Normal effort of breathing, regular heart rate ABDOMEN: Soft, nontender, gravid PELVIC: Normal external female genitalia. Vagina is pink and rugated. Cervix with normal contour, no lesions. Normal discharge.  No pooling.   Cervical exam: deferred     Fetal Monitoring: Baseline: 145 Variability: moderate Accelerations: 15x15 Decelerations: none Contractions: none  Results for orders placed or performed during the hospital encounter of 08/05/19 (from the past 24 hour(s))  POCT fern test     Status: None   Collection Time: 08/05/19 10:13 AM  Result Value Ref Range   POCT Fern Test Negative = intact amniotic membranes      A: [facility] at [redacted]w[redacted]d  Membranes intact  P: discharge home  Keep ob appointment tomorrow Labor precautions & fetal movement count education provided  [redacted]w[redacted]d, NP 08/05/2019 10:20 AM

## 2019-08-05 NOTE — MAU Note (Signed)
Pt reporting clear liquid discharge for 2 days, unsure if SROM.  Denies VB, contractions. +VFM

## 2019-08-05 NOTE — Discharge Instructions (Signed)
Fetal Movement Counts Patient Name: ________________________________________________ Patient Due Date: ____________________ What is a fetal movement count?  A fetal movement count is the number of times that you feel your baby move during a certain amount of time. This may also be called a fetal kick count. A fetal movement count is recommended for every pregnant woman. You may be asked to start counting fetal movements as early as week 28 of your pregnancy. Pay attention to when your baby is most active. You may notice your baby's sleep and wake cycles. You may also notice things that make your baby move more. You should do a fetal movement count:  When your baby is normally most active.  At the same time each day. A good time to count movements is while you are resting, after having something to eat and drink. How do I count fetal movements? 1. Find a quiet, comfortable area. Sit, or lie down on your side. 2. Write down the date, the start time and stop time, and the number of movements that you felt between those two times. Take this information with you to your health care visits. 3. Write down your start time when you feel the first movement. 4. Count kicks, flutters, swishes, rolls, and jabs. You should feel at least 10 movements. 5. You may stop counting after you have felt 10 movements, or if you have been counting for 2 hours. Write down the stop time. 6. If you do not feel 10 movements in 2 hours, contact your health care provider for further instructions. Your health care provider may want to do additional tests to assess your baby's well-being. Contact a health care provider if:  You feel fewer than 10 movements in 2 hours.  Your baby is not moving like he or she usually does. Date: ____________ Start time: ____________ Stop time: ____________ Movements: ____________ Date: ____________ Start time: ____________ Stop time: ____________ Movements: ____________ Date: ____________  Start time: ____________ Stop time: ____________ Movements: ____________ Date: ____________ Start time: ____________ Stop time: ____________ Movements: ____________ Date: ____________ Start time: ____________ Stop time: ____________ Movements: ____________ Date: ____________ Start time: ____________ Stop time: ____________ Movements: ____________ Date: ____________ Start time: ____________ Stop time: ____________ Movements: ____________ Date: ____________ Start time: ____________ Stop time: ____________ Movements: ____________ Date: ____________ Start time: ____________ Stop time: ____________ Movements: ____________ This information is not intended to replace advice given to you by your health care provider. Make sure you discuss any questions you have with your health care provider. Document Revised: 01/29/2019 Document Reviewed: 01/29/2019 Elsevier Patient Education  2020 [employer]        Signs and Symptoms of Labor Labor is your body's natural process of moving your baby, placenta, and umbilical cord out of your uterus. The process of labor usually starts when your baby is full-term, between 37 and 40 weeks of pregnancy. How will I know when I am close to going into labor? As your body prepares for labor and the birth of your baby, you may notice the following symptoms in the weeks and days before true labor starts:  Having a strong desire to get your home ready to receive your new baby. This is called nesting. Nesting may be a sign that labor is approaching, and it may occur several weeks before birth. Nesting may involve cleaning and organizing your home.  Passing a small amount of thick, bloody mucus out of your vagina (normal bloody show or losing your mucus plug). This may happen more than a   week before labor begins, or it might occur right before labor begins as the opening of the cervix starts to widen (dilate). For some women, the entire mucus plug passes at once. For others,  smaller portions of the mucus plug may gradually pass over several days.  Your baby moving (dropping) lower in your pelvis to get into position for birth (lightening). When this happens, you may feel more pressure on your bladder and pelvic bone and less pressure on your ribs. This may make it easier to breathe. It may also cause you to need to urinate more often and have problems with bowel movements.  Having "practice contractions" (Braxton Hicks contractions) that occur at irregular (unevenly spaced) intervals that are more than 10 minutes apart. This is also called false labor. False labor contractions are common after exercise or sexual activity, and they will stop if you change position, rest, or drink fluids. These contractions are usually mild and do not get stronger over time. They may feel like: ? A backache or back pain. ? Mild cramps, similar to menstrual cramps. ? Tightening or pressure in your abdomen. Other early symptoms that labor may be starting soon include:  Nausea or loss of appetite.  Diarrhea.  Having a sudden burst of energy, or feeling very tired.  Mood changes.  Having trouble sleeping. How will I know when labor has begun? Signs that true labor has begun may include:  Having contractions that come at regular (evenly spaced) intervals and increase in intensity. This may feel like more intense tightening or pressure in your abdomen that moves to your back. ? Contractions may also feel like rhythmic pain in your upper thighs or back that comes and goes at regular intervals. ? For first-time mothers, this change in intensity of contractions often occurs at a more gradual pace. ? Women who have given birth before may notice a more rapid progression of contraction changes.  Having a feeling of pressure in the vaginal area.  Your water breaking (rupture of membranes). This is when the sac of fluid that surrounds your baby breaks. When this happens, you will notice  fluid leaking from your vagina. This may be clear or blood-tinged. Labor usually starts within 24 hours of your water breaking, but it may take longer to begin. ? Some women notice this as a gush of fluid. ? Others notice that their underwear repeatedly becomes damp. Follow these instructions at home:   When labor starts, or if your water breaks, call your health care provider or nurse care line. Based on your situation, they will determine when you should go in for an exam.  When you are in early labor, you may be able to rest and manage symptoms at home. Some strategies to try at home include: ? Breathing and relaxation techniques. ? Taking a warm bath or shower. ? Listening to music. ? Using a heating pad on the lower back for pain. If you are directed to use heat:  Place a towel between your skin and the heat source.  Leave the heat on for 20-30 minutes.  Remove the heat if your skin turns bright red. This is especially important if you are unable to feel pain, heat, or cold. You may have a greater risk of getting burned. Get help right away if:  You have painful, regular contractions that are 5 minutes apart or less.  Labor starts before you are [redacted] weeks along in your pregnancy.  You have a fever.  You have   a headache that does not go away.  You have bright red blood coming from your vagina.  You do not feel your baby moving.  You have a sudden onset of: ? Severe headache with vision problems. ? Nausea, vomiting, or diarrhea. ? Chest pain or shortness of breath. These symptoms may be an emergency. If your health care provider recommends that you go to the hospital or birth center where you plan to deliver, do not drive yourself. Have someone else drive you, or call emergency services (911 in the [place] Summary  Labor is your body's natural process of moving your baby, placenta, and umbilical cord out of your uterus.  The process of labor usually starts when your baby is  full-term, between 37 and 40 weeks of pregnancy.  When labor starts, or if your water breaks, call your health care provider or nurse care line. Based on your situation, they will determine when you should go in for an exam. This information is not intended to replace advice given to you by your health care provider. Make sure you discuss any questions you have with your health care provider. Document Revised: 03/11/2017 Document Reviewed: 11/16/2016 Elsevier Patient Education  2020 [employer] Inc[employer]  

## 2019-08-06 ENCOUNTER — Other Ambulatory Visit (HOSPITAL_COMMUNITY)
Admission: RE | Admit: 2019-08-06 | Discharge: 2019-08-06 | Disposition: A | Discharge status: Home / Self Care | Payer: Medicaid Other | Source: Ambulatory Visit | Attending: Obstetrics and Gynecology | Admitting: Obstetrics and Gynecology

## 2019-08-06 ENCOUNTER — Ambulatory Visit (INDEPENDENT_AMBULATORY_CARE_PROVIDER_SITE_OTHER): Payer: Medicaid Other | Admitting: Obstetrics and Gynecology

## 2019-08-06 ENCOUNTER — Other Ambulatory Visit: Payer: Self-pay

## 2019-08-06 ENCOUNTER — Encounter: Payer: Self-pay | Admitting: Obstetrics and Gynecology

## 2019-08-06 VITALS — BP 118/68 | HR 105 | Wt 304.8 lb

## 2019-08-06 DIAGNOSIS — Z3A36 36 weeks gestation of pregnancy: Secondary | ICD-10-CM | POA: Diagnosis present

## 2019-08-06 DIAGNOSIS — Z3483 Encounter for supervision of other normal pregnancy, third trimester: Secondary | ICD-10-CM | POA: Diagnosis not present

## 2019-08-06 DIAGNOSIS — Z1389 Encounter for screening for other disorder: Secondary | ICD-10-CM

## 2019-08-06 DIAGNOSIS — Z331 Pregnant state, incidental: Secondary | ICD-10-CM

## 2019-08-06 LAB — POCT URINALYSIS DIPSTICK OB
Glucose, UA: NEGATIVE
Ketones, UA: NEGATIVE
Leukocytes, UA: NEGATIVE
Nitrite, UA: NEGATIVE
POC,PROTEIN,UA: NEGATIVE

## 2019-08-06 NOTE — Progress Notes (Signed)
Patient ID: Molly Zimmerman, female   DOB: [birth year], 24 y.o.   MRN: [id number]    LOW-RISK PREGNANCY VISIT Patient name: Molly Zimmerman MRN [id number]  Date of birth: 11-19-1995 Chief Complaint:   Routine Prenatal Visit (GBS, GC/CHL)  History of Present Illness:   Molly Zimmerman is a 24 y.o. G40P2002 female at 101w1d with an Estimated Date of Delivery: 09/02/19 being seen today for ongoing management of a low-risk pregnancy. She has been trying to rest as much as she can due to back ache and keeping low stress.  Today she reports backache due to pressure of baby.  Contractions: Irregular. Vag. Bleeding: None.  Movement: Present. denies leaking of fluid. Review of Systems:   Pertinent items are noted in HPI Denies abnormal vaginal discharge w/ itching/odor/irritation, headaches, visual changes, shortness of breath, chest pain, abdominal pain, severe nausea/vomiting, or problems with urination or bowel movements unless otherwise stated above. Pertinent History Reviewed:  Reviewed past medical,surgical, social, obstetrical and family history.  Reviewed problem list, medications and allergies. Physical Assessment:   Vitals:   08/06/19 1033  BP: 118/68  Pulse: (!) 105  Weight: (!) 304 lb 12.8 oz (138.3 kg)  Body mass index is 52.32 kg/m.        Physical Examination:   General appearance: Well appearing, and in no distress  Mental status: Alert, oriented to person, place, and time  Skin: Warm & dry  Cardiovascular: Normal heart rate noted  Respiratory: Normal respiratory effort, no distress  Abdomen: Soft, gravid, nontender  Pelvic: Cervical exam performed long and closed, 1 cm -3 gestation        Extremities: Edema: None  Fetal Status: Fetal Heart Rate (bpm): 154 Fundal Height: 41 cm Movement: Present    Chaperone: Arnette Norris    No results found for this or any previous visit (from the past 24 hour(s)).  Assessment & Plan:  1) Low-risk pregnancy G3P2002 at [redacted]w[redacted]d with an Estimated Date of  Delivery: 09/02/19    Meds: No orders of the defined types were placed in this encounter.  Labs/procedures today: GBS/GCCHL  Plan:  Continue routine obstetrical care  Next visit: will be online    Reviewed: Preterm labor symptoms and general obstetric precautions including but not limited to vaginal bleeding, contractions, leaking of fluid and fetal movement were reviewed in detail with the patient.  All questions were answered.   Follow-up: Return in about 2 weeks (around 08/20/2019) for Televisit.  Orders Placed This Encounter  Procedures  . Strep Gp B NAA  . POC Urinalysis Dipstick OB   By signing my name below, I, Arnette Norris, attest that this documentation has been prepared under the direction and in the presence of Tilda Burrow, MD. Electronically Signed: Arnette Norris Medical Scribe. 08/06/19. 11:10 AM.  I personally performed the services described in this documentation, which was SCRIBED in my presence. The recorded information has been reviewed and considered accurate. It has been edited as necessary during review. Tilda Burrow, MD

## 2019-08-07 LAB — CERVICOVAGINAL ANCILLARY ONLY
Chlamydia: NEGATIVE
Comment: NEGATIVE
Comment: NORMAL
Neisseria Gonorrhea: NEGATIVE

## 2019-08-08 LAB — STREP GP B NAA: Strep Gp B NAA: POSITIVE — AB

## 2019-08-20 ENCOUNTER — Ambulatory Visit (INDEPENDENT_AMBULATORY_CARE_PROVIDER_SITE_OTHER): Payer: Medicaid Other | Admitting: Advanced Practice Midwife

## 2019-08-20 ENCOUNTER — Encounter: Payer: Self-pay | Admitting: Advanced Practice Midwife

## 2019-08-20 ENCOUNTER — Other Ambulatory Visit: Payer: Self-pay

## 2019-08-20 VITALS — BP 138/79 | HR 97 | Wt 310.0 lb

## 2019-08-20 DIAGNOSIS — Z3483 Encounter for supervision of other normal pregnancy, third trimester: Secondary | ICD-10-CM

## 2019-08-20 DIAGNOSIS — Z331 Pregnant state, incidental: Secondary | ICD-10-CM

## 2019-08-20 DIAGNOSIS — O26843 Uterine size-date discrepancy, third trimester: Secondary | ICD-10-CM

## 2019-08-20 DIAGNOSIS — Z3A38 38 weeks gestation of pregnancy: Secondary | ICD-10-CM

## 2019-08-20 DIAGNOSIS — Z1389 Encounter for screening for other disorder: Secondary | ICD-10-CM

## 2019-08-20 LAB — POCT URINALYSIS DIPSTICK OB
Blood, UA: NEGATIVE
Glucose, UA: NEGATIVE
Ketones, UA: NEGATIVE
Leukocytes, UA: NEGATIVE
Nitrite, UA: NEGATIVE
POC,PROTEIN,UA: NEGATIVE

## 2019-08-20 NOTE — Progress Notes (Signed)
   LOW-RISK PREGNANCY VISIT Patient name: Molly Zimmerman MRN [id number]  Date of birth: [birth year] Chief Complaint:   Routine Prenatal Visit  History of Present Illness:   Molly Zimmerman is a 24 y.o. G35P2002 female at [redacted]w[redacted]d with an Estimated Date of Delivery: 09/02/19 being seen today for ongoing management of a low-risk pregnancy.  Today she reports some false labor last nigth. Contractions: Irregular.  .  Movement: Present. denies leaking of fluid. Review of Systems:   Pertinent items are noted in HPI Denies abnormal vaginal discharge w/ itching/odor/irritation, headaches, visual changes, shortness of breath, chest pain, abdominal pain, severe nausea/vomiting, or problems with urination or bowel movements unless otherwise stated above. Pertinent History Reviewed:  Reviewed past medical,surgical, social, obstetrical and family history.  Reviewed problem list, medications and allergies. Physical Assessment:   Vitals:   08/20/19 1025  BP: 138/79  Pulse: 97  Weight: (!) 310 lb (140.6 kg)  Body mass index is 53.21 kg/m.        Physical Examination:   General appearance: Well appearing, and in no distress  Mental status: Alert, oriented to person, place, and time  Skin: Warm & dry  Cardiovascular: Normal heart rate noted  Respiratory: Normal respiratory effort, no distress  Abdomen: Soft, gravid, nontender  Pelvic: Cervical exam performed         Extremities: Edema: Trace  Fetal Status:     Movement: Present    Chaperone: Amanda Rash    Results for orders placed or performed in visit on 08/20/19 (from the past 24 hour(s))  POC Urinalysis Dipstick OB   Collection Time: 08/20/19 10:31 AM  Result Value Ref Range   Color, UA     Clarity, UA     Glucose, UA Negative Negative   Bilirubin, UA     Ketones, UA neg    Spec Grav, UA     Blood, UA neg    pH, UA     POC,PROTEIN,UA Negative Negative, Trace, Small (1+), Moderate (2+), Large (3+), 4+   Urobilinogen, UA     Nitrite, UA neg    Leukocytes, UA Negative Negative   Appearance     Odor      Assessment & Plan:  1) Low-risk pregnancy G3P2002 at [redacted]w[redacted]d with an Estimated Date of Delivery: 09/02/19     Meds: No orders of the defined types were placed in this encounter.  Labs/procedures today: none  Plan:  Continue routine obstetrical care  Next visit: prefers in person    Reviewed: Term labor symptoms and general obstetric precautions including but not limited to vaginal bleeding, contractions, leaking of fluid and fetal movement were reviewed in detail with the patient.  All questions were answered. Has home bp cuff. Check bp weekly, let us know if >140/90.   Follow-up: Return in about 1 week (around 08/27/2019) for LROB, US:EFW.  Orders Placed This Encounter  Procedures  . US OB Follow Up  . POC Urinalysis Dipstick OB   Jacklyn Shell DNP, CNM 08/20/2019 10:39 AM

## 2019-08-20 NOTE — Patient Instructions (Addendum)
AM I IN LABOR? What is labor? Labor is the work that your body does to birth your baby. Your uterus (the womb) contracts. Your cervix (the mouth of the uterus) opens. You will push your baby out into the world.  What do contractions (labor pains) feel like? When they first start, contractions usually feel like cramps during your period. Sometimes you feel pain in your back. Most often, contractions feel like muscles pulling painfully in your lower belly. At first, the contractions will probably be 15 to 20 minutes apart. They will not feel too painful. As labor goes on, the contractions get stronger, closer together, and more painful.  How do I time the contractions? Time your contractions by counting the number of minutes from the start of one contraction to the start of the next contraction.  What should I do when the contractions start? If it is night and you can sleep, sleep. If it happens during the day, here are some things you can do to take care of yourself at home: ? Walk. If the pains you are having are real labor, walking will make the contractions come faster and harder. If the contractions are not going to continue and be real labor, walking will make the contractions slow down. ? Take a shower or bath. This will help you relax. ? Eat. Labor is a big event. It takes a lot of energy. ? Drink water. Not drinking enough water can cause false labor (contractions that hurt but do not open your cervix). If this is true labor, drinking water will help you have strength to get through your labor. ? Take a nap. Get all the rest you can. ? Get a massage. If your labor is in your back, a strong massage on your lower back may feel very good. Getting a foot massage is always good. ? Don't panic. You can do this. Your body was made for this. You are strong!  When should I go to the hospital or call my health care provider? ? Your contractions have been 5 minutes apart or less for at least 1  hour. ? If several contractions are so painful you cannot walk or talk during one. ? Your bag of waters breaks. (You may have a big gush of water or just water that runs down your legs when you walk.)  Are there other reasons to call my health care provider? Yes, you should call your health care provider or go to the hospital if you start to bleed like you are having a period-- blood that soaks your underwear or runs down your legs, if you have sudden severe pain, if your baby has not moved for several hours, or if you are leaking green fluid. The rule is as follows: If you are very concerned about something, call.    Fetal Movement Counts Patient Name: ________________________________________________ Patient Due Date: ____________________ What is a fetal movement count?  A fetal movement count is the number of times that you feel your baby move during a certain amount of time. This may also be called a fetal kick count. A fetal movement count is recommended for every pregnant woman. You may be asked to start counting fetal movements as early as week 28 of your pregnancy. Pay attention to when your baby is most active. You may notice your baby's sleep and wake cycles. You may also notice things that make your baby move more. You should do a fetal movement count:  When your baby is  normally most active.  At the same time each day. A good time to count movements is while you are resting, after having something to eat and drink. How do I count fetal movements? 1. Find a quiet, comfortable area. Sit, or lie down on your side. 2. Write down the date, the start time and stop time, and the number of movements that you felt between those two times. Take this information with you to your health care visits. 3. Write down your start time when you feel the first movement. 4. Count kicks, flutters, swishes, rolls, and jabs. You should feel at least 5 movements in an hour. 5. You may stop counting after  you have felt 5 movements in less than an hour, or if you have been counting for 2 hours. Write down the stop time. 6. If you do not feel 10 movements in 2 hours, contact your health care provider for further instructions. Your health care provider may want to do additional tests to assess your baby's well-being. Contact a health care provider if:  You feel fewer than 10 movements in 2 hours.  Your baby is not moving like he or she usually does. Date: ____________ Start time: ____________ Stop time: ____________ Movements: ____________ Date: ____________ Start time: ____________ Stop time: ____________ Movements: ____________ Date: ____________ Start time: ____________ Stop time: ____________ Movements: ____________ Date: ____________ Start time: ____________ Stop time: ____________ Movements: ____________ Date: ____________ Start time: ____________ Stop time: ____________ Movements: ____________ Date: ____________ Start time: ____________ Stop time: ____________ Movements: ____________ Date: ____________ Start time: ____________ Stop time: ____________ Movements: ____________ Date: ____________ Start time: ____________ Stop time: ____________ Movements: ____________ Date: ____________ Start time: ____________ Stop time: ____________ Movements: ____________ This information is not intended to replace advice given to you by your health care provider. Make sure you discuss any questions you have with your health care provider. Document Revised: 01/29/2019 Document Reviewed: 01/29/2019 Elsevier Patient Education  2020 [employer]    Cervical Ripening: May try one or both  Red Raspberry Leaf capsules:  two 300mg  or 400mg  tablets with each meal, 2-3 times a day  Potential Side Effects Of Raspberry Leaf:  Most women do not experience any side effects from drinking raspberry leaf tea. However, nausea and loose stools are possible     Evening Primrose Oil capsules: may take 1 to 3 capsules  daily. May also prick one to release the oil and insert it into your vagina at night.  Some of the potential side effects:  Upset stomach  Loose stools or diarrhea  Headaches  Nausea:

## 2019-08-31 ENCOUNTER — Encounter (HOSPITAL_COMMUNITY): Payer: Self-pay | Admitting: Family Medicine

## 2019-08-31 ENCOUNTER — Ambulatory Visit (INDEPENDENT_AMBULATORY_CARE_PROVIDER_SITE_OTHER): Payer: Medicaid Other | Admitting: Women's Health

## 2019-08-31 ENCOUNTER — Other Ambulatory Visit: Payer: Self-pay

## 2019-08-31 ENCOUNTER — Inpatient Hospital Stay (HOSPITAL_COMMUNITY): Payer: Medicaid Other | Admitting: Anesthesiology

## 2019-08-31 ENCOUNTER — Encounter: Payer: Self-pay | Admitting: Women's Health

## 2019-08-31 ENCOUNTER — Ambulatory Visit (INDEPENDENT_AMBULATORY_CARE_PROVIDER_SITE_OTHER): Payer: Medicaid Other

## 2019-08-31 ENCOUNTER — Inpatient Hospital Stay (HOSPITAL_COMMUNITY)
Admission: AD | Admit: 2019-08-31 | Discharge: 2019-09-03 | DRG: 797 | Disposition: A | Discharge status: Home / Self Care | Payer: Medicaid Other | Attending: Obstetrics & Gynecology | Admitting: Obstetrics & Gynecology

## 2019-08-31 VITALS — BP 137/81 | HR 94 | Wt 314.0 lb

## 2019-08-31 DIAGNOSIS — O3663X0 Maternal care for excessive fetal growth, third trimester, not applicable or unspecified: Secondary | ICD-10-CM | POA: Diagnosis present

## 2019-08-31 DIAGNOSIS — Z3A39 39 weeks gestation of pregnancy: Secondary | ICD-10-CM | POA: Diagnosis not present

## 2019-08-31 DIAGNOSIS — Z1389 Encounter for screening for other disorder: Secondary | ICD-10-CM

## 2019-08-31 DIAGNOSIS — Z87891 Personal history of nicotine dependence: Secondary | ICD-10-CM | POA: Diagnosis not present

## 2019-08-31 DIAGNOSIS — Z3483 Encounter for supervision of other normal pregnancy, third trimester: Secondary | ICD-10-CM

## 2019-08-31 DIAGNOSIS — Z331 Pregnant state, incidental: Secondary | ICD-10-CM

## 2019-08-31 DIAGNOSIS — O99824 Streptococcus B carrier state complicating childbirth: Secondary | ICD-10-CM | POA: Diagnosis present

## 2019-08-31 DIAGNOSIS — O99344 Other mental disorders complicating childbirth: Secondary | ICD-10-CM | POA: Diagnosis present

## 2019-08-31 DIAGNOSIS — F3162 Bipolar disorder, current episode mixed, moderate: Secondary | ICD-10-CM | POA: Diagnosis present

## 2019-08-31 DIAGNOSIS — O139 Gestational [pregnancy-induced] hypertension without significant proteinuria, unspecified trimester: Secondary | ICD-10-CM | POA: Diagnosis present

## 2019-08-31 DIAGNOSIS — Z20822 Contact with and (suspected) exposure to covid-19: Secondary | ICD-10-CM | POA: Diagnosis present

## 2019-08-31 DIAGNOSIS — O3663X1 Maternal care for excessive fetal growth, third trimester, fetus 1: Secondary | ICD-10-CM

## 2019-08-31 DIAGNOSIS — Z302 Encounter for sterilization: Secondary | ICD-10-CM

## 2019-08-31 DIAGNOSIS — R030 Elevated blood-pressure reading, without diagnosis of hypertension: Secondary | ICD-10-CM | POA: Diagnosis present

## 2019-08-31 DIAGNOSIS — Z362 Encounter for other antenatal screening follow-up: Secondary | ICD-10-CM

## 2019-08-31 DIAGNOSIS — O26843 Uterine size-date discrepancy, third trimester: Secondary | ICD-10-CM

## 2019-08-31 DIAGNOSIS — O99324 Drug use complicating childbirth: Secondary | ICD-10-CM | POA: Diagnosis present

## 2019-08-31 DIAGNOSIS — F1290 Cannabis use, unspecified, uncomplicated: Secondary | ICD-10-CM | POA: Diagnosis present

## 2019-08-31 DIAGNOSIS — O99214 Obesity complicating childbirth: Secondary | ICD-10-CM | POA: Diagnosis present

## 2019-08-31 DIAGNOSIS — O134 Gestational [pregnancy-induced] hypertension without significant proteinuria, complicating childbirth: Secondary | ICD-10-CM | POA: Diagnosis present

## 2019-08-31 DIAGNOSIS — O99210 Obesity complicating pregnancy, unspecified trimester: Secondary | ICD-10-CM

## 2019-08-31 LAB — CBC
HCT: 37.6 % (ref 36.0–46.0)
Hemoglobin: 12.3 g/dL (ref 12.0–15.0)
MCH: 27.8 pg (ref 26.0–34.0)
MCHC: 32.7 g/dL (ref 30.0–36.0)
MCV: 84.9 fL (ref 80.0–100.0)
Platelets: 200 10*3/uL (ref 150–400)
RBC: 4.43 MIL/uL (ref 3.87–5.11)
RDW: 13.3 % (ref 11.5–15.5)
WBC: 8.1 10*3/uL (ref 4.0–10.5)
nRBC: 0 % (ref 0.0–0.2)

## 2019-08-31 LAB — COMPREHENSIVE METABOLIC PANEL
ALT: 9 U/L (ref 0–44)
AST: 18 U/L (ref 15–41)
Albumin: 2.7 g/dL — ABNORMAL LOW (ref 3.5–5.0)
Alkaline Phosphatase: 94 U/L (ref 38–126)
Anion gap: 12 (ref 5–15)
BUN: 9 mg/dL (ref 6–20)
CO2: 17 mmol/L — ABNORMAL LOW (ref 22–32)
Calcium: 8.9 mg/dL (ref 8.9–10.3)
Chloride: 106 mmol/L (ref 98–111)
Creatinine, Ser: 0.65 mg/dL (ref 0.44–1.00)
GFR calc Af Amer: >60 mL/min (ref >60)
GFR calc non Af Amer: >60 mL/min (ref >60)
Glucose, Bld: 111 mg/dL — ABNORMAL HIGH (ref 70–99)
Potassium: 3.8 mmol/L (ref 3.5–5.1)
Sodium: 135 mmol/L (ref 135–145)
Total Bilirubin: 0.5 mg/dL (ref 0.3–1.2)
Total Protein: 6.1 g/dL — ABNORMAL LOW (ref 6.5–8.1)

## 2019-08-31 LAB — POCT URINALYSIS DIPSTICK OB
Blood, UA: NEGATIVE
Glucose, UA: NEGATIVE
Ketones, UA: NEGATIVE
Leukocytes, UA: NEGATIVE
Nitrite, UA: NEGATIVE
POC,PROTEIN,UA: NEGATIVE

## 2019-08-31 LAB — TYPE AND SCREEN
ABO/RH(D): O POS
Antibody Screen: NEGATIVE

## 2019-08-31 LAB — PROTEIN / CREATININE RATIO, URINE
Creatinine, Urine: 156.62 mg/dL
Protein Creatinine Ratio: 0.04 mg/mg{Cre} (ref 0.00–0.15)
Total Protein, Urine: 6 mg/dL

## 2019-08-31 LAB — ABO/RH: ABO/RH(D): O POS

## 2019-08-31 LAB — SARS CORONAVIRUS 2 (TAT 6-24 HRS): SARS Coronavirus 2: NEGATIVE

## 2019-08-31 MED ORDER — DIPHENHYDRAMINE HCL 50 MG/ML IJ SOLN: 12.5000 mg | INTRAMUSCULAR | Status: DC | PRN

## 2019-08-31 MED ORDER — SODIUM CHLORIDE 0.9 % IV SOLN
5.0000 10*6.[IU] | Freq: Once | INTRAVENOUS | Status: AC
  Administered 2019-08-31: 5 10*6.[IU] via INTRAVENOUS
  Filled 2019-08-31: qty 5

## 2019-08-31 MED ORDER — OXYCODONE-ACETAMINOPHEN 5-325 MG PO TABS: 1.0000 | ORAL_TABLET | ORAL | Status: DC | PRN

## 2019-08-31 MED ORDER — TERBUTALINE SULFATE 1 MG/ML IJ SOLN: 0.2500 mg | Freq: Once | INTRAMUSCULAR | Status: DC | PRN

## 2019-08-31 MED ORDER — SOD CITRATE-CITRIC ACID 500-334 MG/5ML PO SOLN: 30.0000 mL | ORAL | Status: DC | PRN

## 2019-08-31 MED ORDER — FENTANYL CITRATE (PF) 100 MCG/2ML IJ SOLN: 100.0000 ug | INTRAMUSCULAR | Status: DC | PRN

## 2019-08-31 MED ORDER — OXYTOCIN BOLUS FROM INFUSION
500.0000 mL | Freq: Once | INTRAVENOUS | Status: AC
  Administered 2019-09-01: 500 mL via INTRAVENOUS

## 2019-08-31 MED ORDER — OXYTOCIN 40 UNITS IN NORMAL SALINE INFUSION - SIMPLE MED
1.0000 m[IU]/min | INTRAVENOUS | Status: DC
  Administered 2019-08-31: 2 m[IU]/min via INTRAVENOUS

## 2019-08-31 MED ORDER — LACTATED RINGERS IV SOLN: INTRAVENOUS | Status: DC

## 2019-08-31 MED ORDER — SODIUM CHLORIDE (PF) 0.9 % IJ SOLN
INTRAMUSCULAR | Status: DC | PRN
  Administered 2019-08-31: 12 mL/h via EPIDURAL

## 2019-08-31 MED ORDER — FENTANYL-BUPIVACAINE-NACL 0.5-0.125-0.9 MG/250ML-% EP SOLN
12.0000 mL/h | EPIDURAL | Status: DC | PRN
  Filled 2019-08-31: qty 250

## 2019-08-31 MED ORDER — LIDOCAINE HCL (PF) 1 % IJ SOLN
INTRAMUSCULAR | Status: DC | PRN
  Administered 2019-08-31 (×2): 4 mL via EPIDURAL

## 2019-08-31 MED ORDER — PHENYLEPHRINE 40 MCG/ML (10ML) SYRINGE FOR IV PUSH (FOR BLOOD PRESSURE SUPPORT)
80.0000 ug | PREFILLED_SYRINGE | INTRAVENOUS | Status: DC | PRN
  Filled 2019-08-31: qty 10

## 2019-08-31 MED ORDER — LACTATED RINGERS IV SOLN: 500.0000 mL | Freq: Once | INTRAVENOUS | Status: DC

## 2019-08-31 MED ORDER — OXYCODONE-ACETAMINOPHEN 5-325 MG PO TABS: 2.0000 | ORAL_TABLET | ORAL | Status: DC | PRN

## 2019-08-31 MED ORDER — ONDANSETRON HCL 4 MG/2ML IJ SOLN: 4.0000 mg | Freq: Four times a day (QID) | INTRAMUSCULAR | Status: DC | PRN

## 2019-08-31 MED ORDER — LIDOCAINE HCL (PF) 1 % IJ SOLN: 30.0000 mL | INTRAMUSCULAR | Status: DC | PRN

## 2019-08-31 MED ORDER — PHENYLEPHRINE 40 MCG/ML (10ML) SYRINGE FOR IV PUSH (FOR BLOOD PRESSURE SUPPORT): 80.0000 ug | PREFILLED_SYRINGE | INTRAVENOUS | Status: DC | PRN

## 2019-08-31 MED ORDER — LACTATED RINGERS IV SOLN: 500.0000 mL | INTRAVENOUS | Status: DC | PRN

## 2019-08-31 MED ORDER — OXYTOCIN 40 UNITS IN NORMAL SALINE INFUSION - SIMPLE MED
2.5000 [IU]/h | INTRAVENOUS | Status: DC
  Filled 2019-08-31: qty 1000

## 2019-08-31 MED ORDER — EPHEDRINE 5 MG/ML INJ: 10.0000 mg | INTRAVENOUS | Status: DC | PRN

## 2019-08-31 MED ORDER — ACETAMINOPHEN 325 MG PO TABS: 650.0000 mg | ORAL_TABLET | ORAL | Status: DC | PRN

## 2019-08-31 MED ORDER — MISOPROSTOL 50MCG HALF TABLET
50.0000 ug | ORAL_TABLET | ORAL | Status: DC | PRN
  Filled 2019-08-31: qty 1

## 2019-08-31 MED ORDER — PENICILLIN G POT IN DEXTROSE 60000 UNIT/ML IV SOLN
3.0000 10*6.[IU] | INTRAVENOUS | Status: DC
  Administered 2019-08-31: 3 10*6.[IU] via INTRAVENOUS
  Filled 2019-08-31: qty 50

## 2019-08-31 NOTE — Progress Notes (Signed)
Patient Vitals for the past 4 hrs:  BP Temp Temp src Pulse Resp SpO2 Height Weight  08/31/19 2202 (!) 120/56 -- -- 92 17 -- -- --  08/31/19 2142 120/69 -- -- 91 17 -- -- --  08/31/19 2132 102/61 -- -- 98 17 -- -- --  08/31/19 2111 122/65 -- -- (!) 102 17 98 % -- --  08/31/19 2106 122/67 -- -- (!) 112 17 98 % -- --  08/31/19 2101 129/68 -- -- (!) 124 17 99 % -- --  08/31/19 2055 133/69 -- -- 94 17 99 % -- --  08/31/19 2049 138/78 -- -- (!) 107 17 -- -- --  08/31/19 2045 (!) 144/74 -- -- (!) 101 17 -- -- --  08/31/19 2040 136/75 -- -- 99 17 -- -- --  08/31/19 2037 133/79 -- -- 99 17 -- -- --  08/31/19 2017 136/75 -- -- 92 17 -- -- --  08/31/19 1933 122/71 -- -- (!) 109 17 -- -- --  08/31/19 1920 -- 97.9 F (36.6 C) Oral -- -- -- -- --  08/31/19 1822 -- -- -- -- -- -- 5\' 4"  (1.626 m) (!) 142.4 kg  08/31/19 1820 135/76 -- -- (!) 115 20 -- -- --   Comfortable with epidural.  Csx 6-7/709/-2  AROM w/clear fluid. Ctx q 2 minutes. Pitocin at 2 mu/min. FHR Cat 1. Continue present mgt.

## 2019-08-31 NOTE — Anesthesia Procedure Notes (Signed)
Epidural Patient location during procedure: OB Start time: 08/31/2019 8:38 PM End time: 08/31/2019 8:45 PM  Staffing Anesthesiologist: Mal Amabile, MD Performed: anesthesiologist   Preanesthetic Checklist Completed: patient identified, IV checked, site marked, risks and benefits discussed, surgical consent, monitors and equipment checked, pre-op evaluation and timeout performed  Epidural Patient position: sitting Prep: DuraPrep and site prepped and draped Patient monitoring: continuous pulse ox and blood pressure Approach: midline Location: L4-L5 Injection technique: LOR air  Needle:  Needle type: Tuohy  Needle gauge: 17 G Needle length: 9 cm and 9 Needle insertion depth: 8 cm Catheter type: closed end flexible Catheter size: 19 Gauge Catheter at skin depth: 13 cm Test dose: negative and Other  Assessment Events: blood not aspirated, injection not painful, no injection resistance, no paresthesia and negative IV test  Additional Notes Patient identified. Risks and benefits discussed including failed block, incomplete  Pain control, post dural puncture headache, nerve damage, paralysis, blood pressure Changes, nausea, vomiting, reactions to medications-both toxic and allergic and post Partum back pain. All questions were answered. Patient expressed understanding and wished to proceed. Sterile technique was used throughout procedure. Epidural site was Dressed with sterile barrier dressing. No paresthesias, signs of intravascular injection Or signs of intrathecal spread were encountered.  Patient was more comfortable after the epidural was dosed. Please see RN's note for documentation of vital signs and FHR which are stable. Reason for block:procedure for pain

## 2019-08-31 NOTE — Progress Notes (Signed)
[place] 39+5 wks,cephalic,anterior placenta gr 3,afi 14 cm,fhr 154 bpm,EFW 4719 g 99%

## 2019-08-31 NOTE — Progress Notes (Signed)
LOW-RISK PREGNANCY VISIT Patient name: Molly Zimmerman MRN [id number]  Date of birth: [birth year] Chief Complaint:   Routine Prenatal Visit ([place] today)  History of Present Illness:   Molly Zimmerman is a 24 y.o. G50P2002 female at [redacted]w[redacted]d with an Estimated Date of Delivery: 09/02/19 being seen today for ongoing management of a low-risk pregnancy.  Today she reports occ mild ha's x 2wks, apap helps. No visual changes, ruq/epigastric pain. Some nausea, no vomiting. Mom is her only childcare and she flies out of the state Thurs am. No h/o GHTN/pre-e, this is potentially a new FOB from her other children. Contractions: Irregular. Vag. Bleeding: None.  Movement: Present. denies leaking of fluid. Review of Systems:   Pertinent items are noted in HPI Denies abnormal vaginal discharge w/ itching/odor/irritation, headaches, visual changes, shortness of breath, chest pain, abdominal pain, severe nausea/vomiting, or problems with urination or bowel movements unless otherwise stated above. Pertinent History Reviewed:  Reviewed past medical,surgical, social, obstetrical and family history.  Reviewed problem list, medications and allergies. Physical Assessment:   Vitals:   08/31/19 1444 08/31/19 1447  BP: (!) 145/76 137/81  Pulse: 97 94  Weight: (!) 314 lb (142.4 kg)   Body mass index is 53.9 kg/m.        Physical Examination:   General appearance: Well appearing, and in no distress  Mental status: Alert, oriented to person, place, and time  Skin: Warm & dry  Cardiovascular: Normal heart rate noted  Respiratory: Normal respiratory effort, no distress  Abdomen: Soft, gravid, nontender  Pelvic: Cervical exam performed  Dilation: 1.5 Effacement (%): Thick Station: Ballotable  Extremities: Edema: Moderate pitting, indentation subsides rapidly, DTRs 1-2+, no clonus  Fetal Status: Fetal Heart Rate (bpm): 154 u/s Fundal Height: 44 cm Movement: Present Presentation: Vertex [place] 35+7 wks,cephalic,anterior placenta gr  3,afi 14 cm,fhr 154 bpm,EFW 4719 g 99%  Chaperone: Levy Pupa    Results for orders placed or performed in visit on 08/31/19 (from the past 24 hour(s))  POC Urinalysis Dipstick OB   Collection Time: 08/31/19  2:45 PM  Result Value Ref Range   Color, UA     Clarity, UA     Glucose, UA Negative Negative   Bilirubin, UA     Ketones, UA neg    Spec Grav, UA     Blood, UA neg    pH, UA     POC,PROTEIN,UA Negative Negative, Trace, Small (1+), Moderate (2+), Large (3+), 4+   Urobilinogen, UA     Nitrite, UA neg    Leukocytes, UA Negative Negative   Appearance     Odor      Assessment & Plan:  1) Low-risk pregnancy G3P2002 at [redacted]w[redacted]d with an Estimated Date of Delivery: 09/02/19   2) Initially elevated bp, high-normal on recheck, no proteinuria, mild headaches r/b apap, otherwise asymptomatic  3) Suspected fetal macrosomia> EFW 4719g/10+lb, OOR on all measurements but femur. Pelvis proven to 9lb11oz, denies shoulder dystocia, states baby 'flew right out'  4) Childcare issues> pt's mom who is only source of childcare for other children goes out of town this Thurs am   Meds: No orders of the defined types were placed in this encounter.  Labs/procedures today: u/s, sve, foley bulb  Plan:  Discussed all w/ Derrill Memo, CNM currently on L&D, agrees w/ direct admit Cervical foley bulb inserted and inflated w/ 67ml LR w/o difficulty  NST: FHR baseline 145 bpm, Variability: moderate, Accelerations:present, Decelerations:  Absent= Cat 1/Reactive Toco: occasional   Pt  to go to L&D. Birdena Crandall, L&D charge RN notified   Follow-up: Return for will be scheduled postpartum.  Orders Placed This Encounter  Procedures  . POC Urinalysis Dipstick OB   Cheral Marker CNM, [facility] 08/31/2019 4:09 PM

## 2019-08-31 NOTE — H&P (Addendum)
Molly Zimmerman is a 24 y.o. female G67P2002 with IUP at [redacted]w[redacted]d presenting for IOL for elevated BPs in office today, suspected macrosomia, social (mom has to leave town on Thursday). [facility].  Prenatal History/Complications:  Term SVD X 2, 2nd baby 9#11oz w/o any problems pushing/deliverying   Past Medical History: Past Medical History:  Diagnosis Date  . Anxiety   . Bipolar 1 disorder (HCC)   . Endometriosis   . History of suicide attempt   . Mood disorder (HCC)   . Obesity   . ODD (oppositional defiant disorder)   . Polysubstance abuse (HCC)   As a teenager, had: Several psyche admissions in the past Suicide attempts (OD of Elavil, wrist cutting) No meds, doing well now  Past Surgical History: Past Surgical History:  Procedure Laterality Date  . ADENOIDECTOMY  Age 21  . TONSILLECTOMY AND ADENOIDECTOMY  Age5    Obstetrical History: OB History    Gravida  3   Para  2   Term  2   Preterm      AB      Living  2     SAB      TAB      Ectopic      Multiple      Live Births  2           Social History: Social History   Socioeconomic History  . Marital status: Single    Spouse name: Not on file  . Number of children: 2  . Years of education: 28  . Highest education level: Some college, no degree  Occupational History  . 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 Smoking status: Former Smoker    Packs/day: 0.25    Types: Cigarettes  . Smokeless tobacco: Never Used  Substance and Sexual Activity  . Alcohol use: Not Currently    Comment: socially   . Drug use: No    Types: Marijuana    Comment: last used 06/27/17  . Sexual activity: Yes    Birth control/protection: None  Other Topics Concern  . Not on file  Social History Narrative  . Not on file   Social Determinants of Health   Financial Resource Strain: Low Risk   . Difficulty of Paying Living Expenses: Not very hard  Food  Insecurity: No Food Insecurity  . Worried About Programme researcher, broadcasting/film/video in the Last Year: Never true  . Ran Out of Food in the Last Year: Never true  Transportation Needs: No Transportation Needs  . Lack of Transportation (Medical): No  . Lack of Transportation (Non-Medical): No  Physical Activity: Insufficiently Active  . Days of Exercise per Week: 3 days  . Minutes of Exercise per Session: 40 min  Stress: Stress Concern Present  . Feeling of Stress : Very much  Social Connections: Somewhat Isolated  . Frequency of Communication with Friends and Family: More than three times a week  . Frequency of Social Gatherings with Friends and Family: Three times a week  . Attends Religious Services: More than 4 times per year  . Active Member of Clubs or Organizations: No  . Attends Banker Meetings: Never  . Marital Status: Never married    Family History: Family History  Problem Relation Age of Onset  . ADD / ADHD Father   . Drug abuse Father   . Diabetes Maternal Grandfather   . Heart disease Paternal  Grandfather   . Cancer Paternal Grandfather        MGM sister  . Heart failure Paternal Grandfather   . Drug abuse Paternal Grandmother     Allergies: No Known Allergies  Medications Prior to Admission  Medication Sig Dispense Refill Last Dose  . LATUDA 20 MG TABS tablet Take 20 mg by mouth every morning.      Marland Kitchen LATUDA 40 MG TABS tablet Take 40 mg by mouth at bedtime.      . Prenat w/o A-FeCb-FeGl-DSS-FA (CITRANATAL RX) 27-1 MG TABS Take 1 tablet by mouth daily. 30 tablet 6         Review of Systems   Constitutional: Negative for fever and chills Eyes: Negative for visual disturbances Respiratory: Negative for shortness of breath, dyspnea Cardiovascular: Negative for chest pain or palpitations  Gastrointestinal: Negative for abdominal pain, vomiting, diarrhea and constipation.   Genitourinary: Negative for dysuria and urgency Musculoskeletal: Negative for back  pain, joint pain, myalgias  Neurological: Negative for dizziness and headaches  137/76 145/81    Height 5\' 4"  (1.626 m), weight (!) 142.4 kg, last menstrual period 11/26/2018. General appearance: alert, cooperative and no distress Lungs: normal respiratory effort Heart: regular rate and rhythm Abdomen: soft, non-tender; bowel sounds normal Extremities: Homans sign is negative, no sign of DVT DTR's 2+ Presentation: cephalic Fetal monitoring  Baseline: 150 bpm, Variability: Good {> 6 bpm), Accelerations: Reactive and Decelerations: Absent Uterine activity  q 5 minutes, mild.  Foley sitting in vagina, removed  Dilation: 5 Effacement (%): 80 Station: -3 Exam by:: [email or web address], CNM   Prenatal labs: ABO, Rh: --/--/PENDING (03/08 1824) Antibody: PENDING (03/08 1824) Rubella: immune RPR: Non Reactive (12/02 0921)  HBsAg: Negative (08/28 1154)  HIV: Non Reactive (12/02 0921)  GBS: --11-12-1992 (02/11 1200)    FAMILY TREE  LAB RESULTS  Language English Pap <21- now 21, will get PP  Initiated care at [facility] GC/CT Initial:  -/-          32wks: -/-  Dating by LMP c/w 9wk    Support person  Genetics NT/IT:neg     AFP:      MaterniT21:normal female    Fairchild/HgbE 10/12/13 -  Flu vaccine Declined 04/10/19  CF 07/19/15 -  TDaP vaccine 07/08/2019 SMA neg  Rhogam n/a Fragile X neg       Anatomy 07/10/2019 Normal female Blood Type O/Positive/-- (08/28 1154)  Feeding Plan breast Antibody Negative (08/28 1154)  Contraception PP LNG-IUD HBsAg Negative (08/28 1154)  Circumcision Yes @ FT w/ JVF RPR Non Reactive (08/28 1154)  Pediatrician List given Rubella  3.19 (08/28 1154)  Prenatal Classes Online discussed HIV Non Reactive (08/28 1154)    GTT/A1C Early:      26-28wks: 78/130/114  BTL Consent 06/24/2019 GBS    pos    [ ]  PCN allergy  VBAC Consent n/a    Waterbirth [ ] Class [ ] Consent [ ] CNM visit PP Needs      EFW:  4179/99% today.    Prenatal Transfer Tool  Maternal Diabetes: No Genetic  Screening: Normal Maternal Ultrasounds/Referrals: Normal Fetal Ultrasounds or other Referrals:  None Maternal Substance Abuse:  No Significant Maternal Medications:  None Significant Maternal Lab Results: Group B Strep positive    Results for orders placed or performed during the hospital encounter of 08/31/19 (from the past 24 hour(s))  Type and screen [facility]   Collection Time: 08/31/19  6:24 PM  Result Value Ref Range  ABO/RH(D) PENDING    Antibody Screen PENDING    Sample Expiration      09/03/2019,2359 Performed at [facility] [address], [place], [place] [address]   Results for orders placed or performed in visit on 08/31/19 (from the past 24 hour(s))  POC Urinalysis Dipstick OB   Collection Time: 08/31/19  2:45 PM  Result Value Ref Range   Color, UA     Clarity, UA     Glucose, UA Negative Negative   Bilirubin, UA     Ketones, UA neg    Spec Grav, UA     Blood, UA neg    pH, UA     POC,PROTEIN,UA Negative Negative, Trace, Small (1+), Moderate (2+), Large (3+), 4+   Urobilinogen, UA     Nitrite, UA neg    Leukocytes, UA Negative Negative   Appearance     Odor      Assessment: Tarrin Menn is a 24 y.o. T6L4650 with an IUP at [redacted]w[redacted]d presenting for IOL for GHTN, suspected macrosomia, social (mom has to leave town on Thursday)..  Plan: #Labor: start pitocin #Pain:  Per request #FWB Cat 1 # GBS:  PCN    Christin Fudge 08/31/2019, 7:02 PM

## 2019-08-31 NOTE — MAU Note (Signed)
covid swab collected. Pt tolerated well. Pt asymptomatic 

## 2019-08-31 NOTE — Anesthesia Preprocedure Evaluation (Signed)
Anesthesia Evaluation  Patient identified by MRN, date of birth, ID band Patient awake    Reviewed: Allergy & Precautions, Patient's Chart, lab work & pertinent test results  Airway Mallampati: III  TM Distance: >3 FB Neck ROM: Full    Dental no notable dental hx. (+) Teeth Intact   Pulmonary neg pulmonary ROS, former smoker,    Pulmonary exam normal breath sounds clear to auscultation       Cardiovascular hypertension, Normal cardiovascular exam Rhythm:Regular Rate:Normal     Neuro/Psych PSYCHIATRIC DISORDERS Anxiety Bipolar Disorder ODDnegative neurological ROS     GI/Hepatic GERD  ,(+)     substance abuse  , Hx/o Polysubstance abuse   Endo/Other  Morbid obesity  Renal/GU negative Renal ROS  negative genitourinary   Musculoskeletal negative musculoskeletal ROS (+)   Abdominal (+) + obese,   Peds  Hematology negative hematology ROS (+)   Anesthesia Other Findings   Reproductive/Obstetrics (+) Pregnancy 39.5 weeks                              Anesthesia Physical Anesthesia Plan  ASA: III  Anesthesia Plan: Epidural   Post-op Pain Management:    Induction:   PONV Risk Score and Plan:   Airway Management Planned: Natural Airway  Additional Equipment:   Intra-op Plan:   Post-operative Plan:   Informed Consent: I have reviewed the patients History and Physical, chart, labs and discussed the procedure including the risks, benefits and alternatives for the proposed anesthesia with the patient or authorized representative who has indicated his/her understanding and acceptance.       Plan Discussed with: Anesthesiologist  Anesthesia Plan Comments:         Anesthesia Quick Evaluation

## 2019-09-01 ENCOUNTER — Inpatient Hospital Stay (HOSPITAL_COMMUNITY): Payer: Medicaid Other | Admitting: Certified Registered Nurse Anesthetist

## 2019-09-01 ENCOUNTER — Encounter (HOSPITAL_COMMUNITY)
Admission: AD | Disposition: A | Discharge status: Home / Self Care | Payer: Self-pay | Source: Home / Self Care | Attending: Obstetrics & Gynecology

## 2019-09-01 ENCOUNTER — Encounter (HOSPITAL_COMMUNITY): Payer: Self-pay | Admitting: Obstetrics & Gynecology

## 2019-09-01 DIAGNOSIS — Z3A39 39 weeks gestation of pregnancy: Secondary | ICD-10-CM

## 2019-09-01 DIAGNOSIS — Z302 Encounter for sterilization: Secondary | ICD-10-CM

## 2019-09-01 HISTORY — PX: TUBAL LIGATION: SHX77

## 2019-09-01 SURGERY — LIGATION, FALLOPIAN TUBE, POSTPARTUM
Anesthesia: Epidural | Site: Abdomen | Laterality: Bilateral | Wound class: Clean Contaminated

## 2019-09-01 MED ORDER — DIBUCAINE (PERIANAL) 1 % EX OINT: 1.0000 "application " | TOPICAL_OINTMENT | CUTANEOUS | Status: DC | PRN

## 2019-09-01 MED ORDER — SODIUM BICARBONATE 8.4 % IV SOLN
INTRAVENOUS | Status: DC | PRN
  Administered 2019-09-01 (×4): 5 mL via EPIDURAL

## 2019-09-01 MED ORDER — WITCH HAZEL-GLYCERIN EX PADS
1.0000 "application " | MEDICATED_PAD | CUTANEOUS | Status: DC | PRN
  Administered 2019-09-01: 1 via TOPICAL

## 2019-09-01 MED ORDER — FAMOTIDINE 20 MG PO TABS
40.0000 mg | ORAL_TABLET | Freq: Once | ORAL | Status: AC
  Administered 2019-09-01: 40 mg via ORAL
  Filled 2019-09-01: qty 2

## 2019-09-01 MED ORDER — DEXMEDETOMIDINE HCL IN NACL 200 MCG/50ML IV SOLN
INTRAVENOUS | Status: AC
  Filled 2019-09-01: qty 50

## 2019-09-01 MED ORDER — SIMETHICONE 80 MG PO CHEW
80.0000 mg | CHEWABLE_TABLET | ORAL | Status: DC | PRN
  Administered 2019-09-01: 80 mg via ORAL
  Filled 2019-09-01: qty 1

## 2019-09-01 MED ORDER — PROPOFOL 10 MG/ML IV BOLUS
INTRAVENOUS | Status: AC
  Filled 2019-09-01: qty 20

## 2019-09-01 MED ORDER — MIDAZOLAM HCL 5 MG/5ML IJ SOLN
INTRAMUSCULAR | Status: DC | PRN
  Administered 2019-09-01 (×2): 1 mg via INTRAVENOUS

## 2019-09-01 MED ORDER — ZOLPIDEM TARTRATE 5 MG PO TABS: 5.0000 mg | ORAL_TABLET | Freq: Every evening | ORAL | Status: DC | PRN

## 2019-09-01 MED ORDER — DEXMEDETOMIDINE HCL IN NACL 200 MCG/50ML IV SOLN
INTRAVENOUS | Status: DC | PRN
  Administered 2019-09-01 (×3): 4 ug via INTRAVENOUS

## 2019-09-01 MED ORDER — FLEET ENEMA 7-19 GM/118ML RE ENEM: 1.0000 | ENEMA | Freq: Every day | RECTAL | Status: DC | PRN

## 2019-09-01 MED ORDER — BUPIVACAINE HCL (PF) 0.25 % IJ SOLN
INTRAMUSCULAR | Status: DC | PRN
  Administered 2019-09-01: 20 mL

## 2019-09-01 MED ORDER — MIDAZOLAM HCL 2 MG/2ML IJ SOLN
INTRAMUSCULAR | Status: AC
  Filled 2019-09-01: qty 2

## 2019-09-01 MED ORDER — FENTANYL CITRATE (PF) 100 MCG/2ML IJ SOLN
INTRAMUSCULAR | Status: AC
  Filled 2019-09-01: qty 2

## 2019-09-01 MED ORDER — BUPIVACAINE HCL (PF) 0.25 % IJ SOLN
INTRAMUSCULAR | Status: AC
  Filled 2019-09-01: qty 30

## 2019-09-01 MED ORDER — ONDANSETRON HCL 4 MG/2ML IJ SOLN
INTRAMUSCULAR | Status: DC | PRN
  Administered 2019-09-01: 4 mg via INTRAVENOUS

## 2019-09-01 MED ORDER — ONDANSETRON HCL 4 MG/2ML IJ SOLN: 4.0000 mg | INTRAMUSCULAR | Status: DC | PRN

## 2019-09-01 MED ORDER — OXYCODONE HCL 5 MG PO TABS
5.0000 mg | ORAL_TABLET | ORAL | Status: DC | PRN
  Administered 2019-09-01 – 2019-09-03 (×5): 5 mg via ORAL
  Filled 2019-09-01 (×5): qty 1

## 2019-09-01 MED ORDER — ACETAMINOPHEN 325 MG PO TABS
650.0000 mg | ORAL_TABLET | ORAL | Status: DC | PRN
  Administered 2019-09-01 – 2019-09-02 (×4): 650 mg via ORAL
  Filled 2019-09-01 (×4): qty 2

## 2019-09-01 MED ORDER — LACTATED RINGERS IV SOLN: INTRAVENOUS | Status: DC | PRN

## 2019-09-01 MED ORDER — FENTANYL CITRATE (PF) 100 MCG/2ML IJ SOLN
INTRAMUSCULAR | Status: DC | PRN
  Administered 2019-09-01 (×2): 50 ug via INTRAVENOUS

## 2019-09-01 MED ORDER — LACTATED RINGERS IV SOLN: INTRAVENOUS | Status: DC

## 2019-09-01 MED ORDER — METHYLERGONOVINE MALEATE 0.2 MG PO TABS: 0.2000 mg | ORAL_TABLET | ORAL | Status: DC | PRN

## 2019-09-01 MED ORDER — ONDANSETRON HCL 4 MG/2ML IJ SOLN
INTRAMUSCULAR | Status: AC
  Filled 2019-09-01: qty 2

## 2019-09-01 MED ORDER — MEASLES, MUMPS & RUBELLA VAC IJ SOLR: 0.5000 mL | Freq: Once | INTRAMUSCULAR | Status: DC

## 2019-09-01 MED ORDER — BISACODYL 10 MG RE SUPP
10.0000 mg | Freq: Every day | RECTAL | Status: DC | PRN
  Administered 2019-09-02: 10 mg via RECTAL
  Filled 2019-09-01: qty 1

## 2019-09-01 MED ORDER — TETANUS-DIPHTH-ACELL PERTUSSIS 5-2.5-18.5 LF-MCG/0.5 IM SUSP: 0.5000 mL | Freq: Once | INTRAMUSCULAR | Status: DC

## 2019-09-01 MED ORDER — METOCLOPRAMIDE HCL 10 MG PO TABS
10.0000 mg | ORAL_TABLET | Freq: Once | ORAL | Status: AC
  Administered 2019-09-01: 10 mg via ORAL
  Filled 2019-09-01: qty 1

## 2019-09-01 MED ORDER — IBUPROFEN 600 MG PO TABS
600.0000 mg | ORAL_TABLET | Freq: Four times a day (QID) | ORAL | Status: DC
  Administered 2019-09-01 – 2019-09-03 (×9): 600 mg via ORAL
  Filled 2019-09-01 (×9): qty 1

## 2019-09-01 MED ORDER — KETOROLAC TROMETHAMINE 30 MG/ML IJ SOLN: 30.0000 mg | Freq: Once | INTRAMUSCULAR | Status: DC | PRN

## 2019-09-01 MED ORDER — BENZOCAINE-MENTHOL 20-0.5 % EX AERO
1.0000 "application " | INHALATION_SPRAY | CUTANEOUS | Status: DC | PRN
  Administered 2019-09-01: 1 via TOPICAL
  Filled 2019-09-01: qty 56

## 2019-09-01 MED ORDER — PRENATAL MULTIVITAMIN CH
1.0000 | ORAL_TABLET | Freq: Every day | ORAL | Status: DC
  Administered 2019-09-02 – 2019-09-03 (×2): 1 via ORAL
  Filled 2019-09-01 (×2): qty 1

## 2019-09-01 MED ORDER — METHYLERGONOVINE MALEATE 0.2 MG/ML IJ SOLN: 0.2000 mg | INTRAMUSCULAR | Status: DC | PRN

## 2019-09-01 MED ORDER — DIPHENHYDRAMINE HCL 25 MG PO CAPS
25.0000 mg | ORAL_CAPSULE | Freq: Four times a day (QID) | ORAL | Status: DC | PRN
  Administered 2019-09-01: 25 mg via ORAL
  Filled 2019-09-01: qty 1

## 2019-09-01 MED ORDER — ACETAMINOPHEN 10 MG/ML IV SOLN: 1000.0000 mg | Freq: Once | INTRAVENOUS | Status: DC | PRN

## 2019-09-01 MED ORDER — COCONUT OIL OIL: 1.0000 "application " | TOPICAL_OIL | Status: DC | PRN

## 2019-09-01 MED ORDER — ONDANSETRON HCL 4 MG PO TABS: 4.0000 mg | ORAL_TABLET | ORAL | Status: DC | PRN

## 2019-09-01 MED ORDER — FENTANYL CITRATE (PF) 100 MCG/2ML IJ SOLN
25.0000 ug | INTRAMUSCULAR | Status: DC | PRN
  Administered 2019-09-01: 50 ug via INTRAVENOUS
  Administered 2019-09-01: 25 ug via INTRAVENOUS

## 2019-09-01 MED ORDER — DOCUSATE SODIUM 100 MG PO CAPS
100.0000 mg | ORAL_CAPSULE | Freq: Two times a day (BID) | ORAL | Status: DC
  Administered 2019-09-02 – 2019-09-03 (×4): 100 mg via ORAL
  Filled 2019-09-01 (×4): qty 1

## 2019-09-01 MED ORDER — FERROUS SULFATE 325 (65 FE) MG PO TABS
325.0000 mg | ORAL_TABLET | Freq: Two times a day (BID) | ORAL | Status: DC
  Administered 2019-09-01 – 2019-09-03 (×3): 325 mg via ORAL
  Filled 2019-09-01 (×4): qty 1

## 2019-09-01 SURGICAL SUPPLY — 20 items
BLADE SURG 11 STRL SS (BLADE) ×2 IMPLANT
DRSG OPSITE POSTOP 3X4 (GAUZE/BANDAGES/DRESSINGS) ×2 IMPLANT
DURAPREP 26ML APPLICATOR (WOUND CARE) ×2 IMPLANT
GLOVE BIOGEL PI IND STRL 7.0 (GLOVE) ×1 IMPLANT
GLOVE BIOGEL PI IND STRL 7.5 (GLOVE) ×1 IMPLANT
GLOVE BIOGEL PI INDICATOR 7.0 (GLOVE) ×1
GLOVE BIOGEL PI INDICATOR 7.5 (GLOVE) ×1
GLOVE ECLIPSE 7.5 STRL STRAW (GLOVE) ×2 IMPLANT
GOWN STRL REUS W/TWL LRG LVL3 (GOWN DISPOSABLE) ×4 IMPLANT
HIBICLENS CHG 4% 4OZ BTL (MISCELLANEOUS) ×2 IMPLANT
NEEDLE HYPO 22GX1.5 SAFETY (NEEDLE) ×2 IMPLANT
NS IRRIG 1000ML POUR BTL (IV SOLUTION) ×2 IMPLANT
PACK ABDOMINAL MINOR (CUSTOM PROCEDURE TRAY) ×2 IMPLANT
PROTECTOR NERVE ULNAR (MISCELLANEOUS) ×2 IMPLANT
SPONGE LAP 4X18 RFD (DISPOSABLE) IMPLANT
SUT VICRYL 0 UR6 27IN ABS (SUTURE) ×2 IMPLANT
SUT VICRYL 4-0 PS2 18IN ABS (SUTURE) ×2 IMPLANT
SYR CONTROL 10ML LL (SYRINGE) ×2 IMPLANT
TOWEL OR 17X24 6PK STRL BLUE (TOWEL DISPOSABLE) ×4 IMPLANT
TRAY FOLEY W/BAG SLVR 14FR (SET/KITS/TRAYS/PACK) ×2 IMPLANT

## 2019-09-01 NOTE — Progress Notes (Signed)
24 y.o. yo [phone]  with undesired fertility,status post vaginal delivery, desires permanent sterilization. Risks and benefits of procedure discussed with patient including permanence of method, bleeding, infection, injury to surrounding organs and need for additional procedures. Risk failure of 0.5-1% with increased risk of ectopic gestation if pregnancy occurs was also discussed with patient.

## 2019-09-01 NOTE — Lactation Note (Signed)
This note was copied from a baby's chart. Lactation Consultation Note Baby 3 hrs old. Mom stated baby has latched well. Having no difficulty latching. Mom has a 22 yr old that she BF for 6 weeks and 22 yr old for 6 months. Mom stated she had over abundant supply of milk. Mom has "V" shaped breast w/everted nipples.  Newborn behavior, STS, I&O, milk storage, supply and demand discussed. Mom states she has no questions or concerns at this time. Encouraged to call for assistance or questions. Lactation brochure given.  Patient Name: Molly Zimmerman YHOOI'L Date: 09/01/2019 Reason for consult: Initial assessment;Term   Maternal Data Has patient been taught Hand Expression?: Yes Does the patient have breastfeeding experience prior to this delivery?: Yes  Feeding Feeding Type: Breast Fed  LATCH Score Latch: Grasps breast easily, tongue down, lips flanged, rhythmical sucking.  Audible Swallowing: Spontaneous and intermittent  Type of Nipple: Everted at rest and after stimulation  Comfort (Breast/Nipple): Soft / non-tender  Hold (Positioning): No assistance needed to correctly position infant at breast.  LATCH Score: 10  Interventions Interventions: Breast feeding basics reviewed;Support pillows;Breast massage;Hand express;Breast compression;Position options  Lactation Tools Discussed/Used WIC Program: Yes   Consult Status Consult Status: Follow-up Date: 09/02/19 Follow-up type: In-patient    Charyl Dancer 09/01/2019, 4:18 AM

## 2019-09-01 NOTE — Lactation Note (Addendum)
This note was copied from a baby's chart. Lactation Consultation Note  Patient Name: Molly Zimmerman YFVCB'S Date: 09/01/2019 Reason for consult: 1st time breastfeeding;Follow-up assessment;Term;Other (Comment)(LGA infant) P3, 13 hour female LGA greater than 9 lbs . Per mom, infant is very sleepy and not latch since he was 3 hours old mom has been hand express giving back few drops by spoon.  Mom has been doing a lot of STS with infant. When LC entered room, infant was fully cloth and laying on bed with mom. Per mom, she wants to breastfeed infant, she attempted with her last child but infant didn't latch so she mostly pumped. Infant as lethargy at first, [facility] assisted mom with hand expression and infant was given 2 mls of EBM by spoon, afterwards infant appeared more alet and started cuing to breastfeed. Mom latched infant on her left breast using the football hold, after few attempts infant sustained latch, but a lot of stimulation, stroking , rubbing infant check to keep infant awake. Mom burped infant to awaked him and re-latch infant at breast, infant took and additional 3 mls and breastfeed off and on breast for 5 minutes. Mom will continue to work on latching infant on breast and ask for latch assistance from RN and [facility] services. Mom knows to breastfeed infant according to hunger cues, 8 to 12 times within 24 hours, on demand and not to exceed 3 hours without breastfeeding infant. Mom will continue to use DEBP every 3 hours for 15 minutes on initial setting, mom will hand express after pumping  and will give infant back and EBM by spoon. Mom will continue  do as much STS as possible.  .  Maternal Data    Feeding Feeding Type: Breast Fed  LATCH Score Latch: Repeated attempts needed to sustain latch, nipple held in mouth throughout feeding, stimulation needed to elicit sucking reflex.  Audible Swallowing: A few with stimulation  Type of Nipple: Everted at rest and after  stimulation  Comfort (Breast/Nipple): Soft / non-tender  Hold (Positioning): Assistance needed to correctly position infant at breast and maintain latch.  LATCH Score: 7  Interventions Interventions: Skin to skin;Adjust position;Support pillows;Position options;Expressed milk;Breast massage;Assisted with latch;Breast compression;DEBP;Hand express  Lactation Tools Discussed/Used     Consult Status Consult Status: Follow-up Date: 09/02/19 Follow-up type: In-patient    Danelle Earthly 09/01/2019, 1:54 PM

## 2019-09-01 NOTE — Progress Notes (Signed)
Honeycomb fully saturated and pooling blood. MD notified. Order given to change dressing and add a pressure dressing. Will continue to monitor.

## 2019-09-01 NOTE — Transfer of Care (Signed)
Immediate Anesthesia Transfer of Care Note  Patient: Molly Zimmerman  Procedure(s) Performed: POST PARTUM TUBAL LIGATION (N/A )  Patient Location: PACU  Anesthesia Type:Epidural  Level of Consciousness: sedated  Airway & Oxygen Therapy: Patient Spontanous Breathing  Post-op Assessment: Report given to RN  Post vital signs: Reviewed and stable  Last Vitals:  Vitals Value Taken Time  BP    Temp    Pulse    Resp    SpO2      Last Pain:  Vitals:   09/01/19 1110  TempSrc: Oral  PainSc:          Complications: No apparent anesthesia complications

## 2019-09-01 NOTE — Anesthesia Postprocedure Evaluation (Signed)
Anesthesia Post Note  Patient: Molly Zimmerman  Procedure(s) Performed: POST PARTUM TUBAL LIGATION (Bilateral Abdomen)     Patient location during evaluation: PACU Anesthesia Type: Epidural Level of consciousness: awake and alert Pain management: pain level controlled Vital Signs Assessment: post-procedure vital signs reviewed and stable Respiratory status: spontaneous breathing, nonlabored ventilation and respiratory function stable Cardiovascular status: stable Postop Assessment: no headache, no backache and epidural receding Anesthetic complications: no    Last Vitals:  Vitals:   09/01/19 1330 09/01/19 1345  BP: (!) 108/56   Pulse: 93 77  Resp: 14 16  Temp:    SpO2: 98% 92%    Last Pain:  Vitals:   09/01/19 1345  TempSrc:   PainSc: 4    Pain Goal:                Epidural/Spinal Function Cutaneous sensation: Normal sensation (09/01/19 1345), Patient able to flex knees: Yes (09/01/19 1345), Patient able to lift hips off bed: Yes (09/01/19 1345), Back pain beyond tenderness at insertion site: No (09/01/19 1345), Progressively worsening motor and/or sensory loss: No (09/01/19 1345), Bowel and/or bladder incontinence post epidural: No (09/01/19 1345)  [name] L Shin Lamour

## 2019-09-01 NOTE — Progress Notes (Signed)
Patient called out stating she was bleeding. Noted blood on the floor and on patients pressure dressing. Called MD about patients bleeding. MD came to the floor and changed pressure dressing and the honeycomb. Will continue to monitor patients dressing for any more bleeding.

## 2019-09-01 NOTE — Discharge Summary (Signed)
Postpartum Discharge Summary     Patient Name: Molly Zimmerman DOB: January 06, 1996 MRN: [id number]  Date of admission: 08/31/2019 Delivering Provider: Christin Fudge   Date of discharge: 09/03/2019  Admitting diagnosis: Gestational hypertension [O13.9] Intrauterine pregnancy: [redacted]w[redacted]d    Secondary diagnosis:  Active Problems:   Bipolar 1 disorder, mixed, moderate ([place]   Marijuana use   Obesity affecting pregnancy, antepartum   Gestational hypertension  Additional problems: none     Discharge diagnosis: Term Pregnancy Delivered and Gestational Hypertension                                                                                                Post partum procedures:postpartum tubal ligation  Augmentation: AROM, Pitocin and Foley Balloon  Complications: None  Hospital course:  Induction of Labor With Vaginal Delivery   24y.o. yo [id number] admitted to the hospital 08/31/2019 for induction of labor.  Indication for induction: Gestational hypertension.  Patient had an uncomplicated labor course as follows: Membrane Rupture Time/Date: 10:14 PM ,08/31/2019   Intrapartum Procedures: Episiotomy: None [1]                                         Lacerations:  1st degree [2]  Patient had delivery of a Viable infant.  Information for the patient's newborn:  JoMaclovia, Uher0[[id number]]Delivery Method: Vag-Spont    09/01/2019  Details of delivery can be found in separate delivery note.  Patient had a routine postpartum course with the addition of a ppBTL on PPD#0. On PPD#1 she had some extra bleeding on her dressing, but it was examined and had stopped and her Hgb was stable at 11.9. She had some social issues for which she saw SW- no barriers to d/c. On PPD#2 she was started on Norvasc [id number] to some elevated BPs. Patient is discharged home 09/03/19. Delivery time: 12:31 AM    Magnesium Sulfate received: No BMZ received:  No Rhophylac:N/A MMR:N/A Transfusion:No  Physical exam  Vitals:   09/02/19 0953 09/02/19 1818 09/02/19 2147 09/03/19 0545  BP: 126/78 (!) 150/80 134/84 140/88  Pulse: 86 79 81 87  Resp:   18 18  Temp: 98 F (36.7 C) 98 F (36.7 C) 97.8 F (36.6 C) 98 F (36.7 C)  TempSrc:  Oral Oral Oral  SpO2:    100%  Weight:      Height:       General: alert and cooperative Lochia: appropriate Uterine Fundus: firm Incision: umbilical honeycomb intact, no drainage DVT Evaluation: No evidence of DVT seen on physical exam. Labs: Lab Results  Component Value Date   WBC 6.9 09/02/2019   HGB 11.9 (L) 09/02/2019   HCT 35.7 (L) 09/02/2019   MCV 84.0 09/02/2019   PLT 156 09/02/2019   CMP Latest Ref Rng & Units 08/31/2019  Glucose 70 - 99 mg/dL 111(H)  BUN 6 - 20 mg/dL 9  Creatinine 0.44 - 1.00 mg/dL 0.65  Sodium 135 - 145 mmol/L 135  Potassium 3.5 - 5.1 mmol/L 3.8  Chloride 98 - 111 mmol/L 106  CO2 22 - 32 mmol/L 17(L)  Calcium 8.9 - 10.3 mg/dL 8.9  Total Protein 6.5 - 8.1 g/dL 6.1(L)  Total Bilirubin 0.3 - 1.2 mg/dL 0.5  Alkaline Phos 38 - 126 U/L 94  AST 15 - 41 U/L 18  ALT 0 - 44 U/L 9   Edinburgh Score: Edinburgh Postnatal Depression Scale Screening Tool 09/01/2019  I have been able to laugh and see the funny side of things. 0  I have looked forward with enjoyment to things. 0  I have blamed myself unnecessarily when things went wrong. 1  I have been anxious or worried for no good reason. 1  I have felt scared or panicky for no good reason. 1  Things have been getting on top of me. 1  I have been so unhappy that I have had difficulty sleeping. 0  I have felt sad or miserable. 0  I have been so unhappy that I have been crying. 0  The thought of harming myself has occurred to me. 0  Edinburgh Postnatal Depression Scale Total 4    Discharge instruction: per After Visit Summary and "Baby and Me Booklet".  After visit meds:  Allergies as of 09/03/2019   No Known Allergies      Medication List    TAKE these medications   amLODipine 5 MG tablet Commonly known as: NORVASC Take 1 tablet (5 mg total) by mouth daily.   CitraNatal Rx 27-1 MG Tabs Take 1 tablet by mouth daily.   ibuprofen 600 MG tablet Commonly known as: ADVIL Take 1 tablet (600 mg total) by mouth every 6 (six) hours as needed.   Latuda 40 MG Tabs tablet Generic drug: lurasidone Take 40 mg by mouth at bedtime.   Latuda 20 MG Tabs tablet Generic drug: lurasidone Take 20 mg by mouth every morning.   oxyCODONE 5 MG immediate release tablet Commonly known as: Oxy IR/ROXICODONE Take 1 tablet (5 mg total) by mouth every 4 (four) hours as needed for severe pain.       Diet: routine diet  Activity: Advance as tolerated. Pelvic rest for 6 weeks.   Outpatient follow up:BP check in 1wk; PP visit in 4wks Follow up Appt: Future Appointments  Date Time Provider [place]  09/08/2019 10:50 AM [facility] NURSE [facility]  10/06/2019  1:50 PM Roma Schanz, CNM [facility]   Follow up Visit:    Please schedule this patient for Postpartum visit in: 4 weeks with the following provider: Any provider Virtual For C/S patients schedule nurse incision check in weeks 2 weeks: no Low risk pregnancy complicated by: HTN Delivery mode:  SVD Anticipated Birth Control:  BTL done PP on PPD#0 PP Procedures needed: BP check in 1 week (virtual) Schedule Integrated BH visit: no     Newborn Data: Live born female  Birth Weight: 4145gm (9lb 2.2oz)   APGAR: 41, 9  Newborn Delivery   Birth date/time: 09/01/2019 00:31:00 Delivery type: Vaginal, Spontaneous      Baby Feeding: Breast Disposition:home with mother   09/03/2019 Myrtis Ser, CNM  7:53 AM

## 2019-09-01 NOTE — Anesthesia Preprocedure Evaluation (Addendum)
Anesthesia Evaluation  Patient identified by MRN, date of birth, ID band Patient awake    Reviewed: Allergy & Precautions, NPO status , Patient's Chart, lab work & pertinent test results  Airway Mallampati: III  TM Distance: >3 FB Neck ROM: Full  Mouth opening: Limited Mouth Opening  Dental no notable dental hx. (+)    Pulmonary neg pulmonary ROS, former smoker,    Pulmonary exam normal breath sounds clear to auscultation       Cardiovascular hypertension (gHTN), Normal cardiovascular exam Rhythm:Regular Rate:Normal     Neuro/Psych PSYCHIATRIC DISORDERS Anxiety Bipolar Disorder negative neurological ROS     GI/Hepatic negative GI ROS, (+)     substance abuse  marijuana use,   Endo/Other  Morbid obesity  Renal/GU negative Renal ROS  negative genitourinary   Musculoskeletal negative musculoskeletal ROS (+)   Abdominal   Peds  Hematology negative hematology ROS (+)   Anesthesia Other Findings PPTL  Reproductive/Obstetrics                            Anesthesia Physical Anesthesia Plan  ASA: III  Anesthesia Plan: Epidural   Post-op Pain Management:    Induction:   PONV Risk Score and Plan: Treatment may vary due to age or medical condition  Airway Management Planned: Natural Airway  Additional Equipment:   Intra-op Plan:   Post-operative Plan:   Informed Consent: I have reviewed the patients History and Physical, chart, labs and discussed the procedure including the risks, benefits and alternatives for the proposed anesthesia with the patient or authorized representative who has indicated his/her understanding and acceptance.       Plan Discussed with: Anesthesiologist  Anesthesia Plan Comments: (Epidural catheter still in place. Worked well for labor per patient. Plan to activate epidural for PPT.)        Anesthesia Quick Evaluation

## 2019-09-01 NOTE — Op Note (Signed)
PREOPERATIVE DIAGNOSIS:  Undesired fertility  POSTOPERATIVE DIAGNOSIS:  Undesired fertility  PROCEDURE:  Postpartum Bilateral Tubal Sterilization using Pomeroy method   ANESTHESIA:  Epidural  COMPLICATIONS:  None immediate.  ESTIMATED BLOOD LOSS:  Less than 20cc.  FLUIDS: 800 cc LR.   INDICATIONS: 24 y.o. yo [phone]  with undesired fertility,status post vaginal delivery, desires permanent sterilization. Risks and benefits of procedure discussed with patient including permanence of method, bleeding, infection, injury to surrounding organs and need for additional procedures. Risk failure of 0.5-1% with increased risk of ectopic gestation if pregnancy occurs was also discussed with patient.   FINDINGS:  Normal uterus, tubes, and ovaries.  TECHNIQUE: After informed consent was obtained, the patient was taken to the operating room where anesthesia was induced and found to be adequate. A small transverse, infraumbilical skin incision was made with the scalpel. This incision was carried down to the underlying layer of fascia. The fascia was grasped with Kocher clamps tented up and entered sharply with Mayo scissors. Underlying peritoneum was then identified tented up and entered sharply with Metzenbaum scissors. The fascia was tagged with 0 Vicryl. The patient's left fallopian tube was then identified, brought to the incision, and grasped with a Babcock clamp. The tube was then followed out to the fimbria. The Babcock clamp was then used to grasp the tube approximately 4 cm from the cornual region. A 3 cm segment of the tube was then ligated with free tie of plain gut suture, transected and excised. Good hemostasis was noted and the tube was returned to the abdomen. The right fallopian tube was then identified to its fimbriated end, ligated, and a 3 cm segment excised in a similar fashion. Excellent hemostasis was noted, and the tube returned to the abdomen. The fascia was re-approximated with 0 Vicryl.  The skin was closed in a subcuticular fashion with 3-0 Vicryl. Quarter percent Marcaine solution was then injected at the incision site. The patient tolerated the procedure well. Sponge, lap, and needle count were correct x2. The patient was taken to recovery room in stable condition.

## 2019-09-01 NOTE — Anesthesia Postprocedure Evaluation (Signed)
Anesthesia Post Note  Patient: Molly Zimmerman  Procedure(s) Performed: AN AD HOC LABOR EPIDURAL     Patient location during evaluation: Mother Baby Anesthesia Type: Epidural Level of consciousness: awake and alert Pain management: pain level controlled Vital Signs Assessment: post-procedure vital signs reviewed and stable Respiratory status: spontaneous breathing, nonlabored ventilation and respiratory function stable Cardiovascular status: stable Postop Assessment: no headache, no backache and epidural receding Anesthetic complications: no Comments: Per telephone conversation    Last Vitals:  Vitals:   09/01/19 0230 09/01/19 0330  BP: 125/66 140/68  Pulse: 90 90  Resp: 16 16  Temp: 36.6 C 37.1 C  SpO2: 99% 100%    Last Pain:  Vitals:   09/01/19 0330  TempSrc: Oral  PainSc: 0-No pain   Pain Goal:                   Jo-Ann Johanning N

## 2019-09-02 LAB — CBC
HCT: 35.7 % — ABNORMAL LOW (ref 36.0–46.0)
Hemoglobin: 11.9 g/dL — ABNORMAL LOW (ref 12.0–15.0)
MCH: 28 pg (ref 26.0–34.0)
MCHC: 33.3 g/dL (ref 30.0–36.0)
MCV: 84 fL (ref 80.0–100.0)
Platelets: 156 10*3/uL (ref 150–400)
RBC: 4.25 MIL/uL (ref 3.87–5.11)
RDW: 13.5 % (ref 11.5–15.5)
WBC: 6.9 10*3/uL (ref 4.0–10.5)
nRBC: 0 % (ref 0.0–0.2)

## 2019-09-02 LAB — RPR: RPR Ser Ql: NONREACTIVE

## 2019-09-02 LAB — SURGICAL PATHOLOGY

## 2019-09-02 NOTE — Progress Notes (Signed)
Went to evaluate bleeding at btl infraumbilical incision site.  Bleeding reported last night, enough to leave an about 3 inch stain on abdominal pad. No need bleeding from what was marked on that pad earlier this evening. That pad and honeycomb removed and incision site is clean and dry and intact with no oozing. No fluctuance. Abdomen is soft, mildly tender. bp and hr wnl. CBC has been drawn. Think this likely subcutaneous bleeding that is now stopped, will manage expectantly. Assuming h/h stable and no sig change in clinical picture will not pursue further eval for intraabdominal bleeding.

## 2019-09-02 NOTE — Lactation Note (Signed)
This note was copied from a baby's chart. Lactation Consultation Note  Patient Name: Molly Zimmerman Date: 09/02/2019 Reason for consult: Follow-up assessment;Term;Infant weight loss P3, 43 hour female LGA -6% weight loss. Infant had 9 voids and 6 stools.  Per mom, infant is latching well at breast,  she has no breastfeeding concerns and most feedings are now 30-40 minutes in length. LC unable assess latch at this time,  per mom, infant breastfed for 30 minutes prior to [facility] entering the room. Mom will continue to breastfeed infant according hunger cues, on demand, 8 to 12 times within 24 hours and not exceed 3 hours without feeding infant. Mom knows to call RN or LC if she has any questions, concerns or need assistance with latching infant at breast.   Maternal Data    Feeding    LATCH Score                   Interventions    Lactation Tools Discussed/Used     Consult Status Consult Status: Follow-up Date: 09/03/19 Follow-up type: In-patient    Danelle Earthly 09/02/2019, 7:48 PM

## 2019-09-02 NOTE — Progress Notes (Signed)
Noted pressure dressing was saturated, also seen honeycomb was saturated when I looked under pressure dressing. Notified MD of bleeding. Will continue to monitor.

## 2019-09-02 NOTE — Progress Notes (Signed)
Post Partum Day 1, POD#1 BTL Subjective: feels 'like crap', BTL incision hurting, denies dizziness/lightheadedness  Eating, drinking, voiding, ambulating well.  +flatus.  Lochia wnl.  Denies dizziness, lightheadedness, or sob. No complaints.   Objective: Blood pressure 135/73, pulse 94, temperature 98 F (36.7 C), temperature source Oral, resp. rate 19, height 5\' 4"  (1.626 m), weight (!) 142.4 kg, last menstrual period 11/26/2018, SpO2 100 %, unknown if currently breastfeeding.  Physical Exam:  General: alert, cooperative and no distress, appears slightly pale Lochia: appropriate Uterine Fundus: firm Incision: mod amt drainage on bulky dressing, marked, abdomen soft, non-distended, notified Dr. 01/26/2019 DVT Evaluation: No evidence of DVT seen on physical exam. Negative Homan's sign. No cords or calf tenderness. No significant calf/ankle edema.  Recent Labs    08/31/19 1824  HGB 12.3  HCT 37.6    Assessment/Plan: Breastfeeding, outpatient circ Bleeding at BTL incision, marked, CBC ordered, Dr. 10/31/19 notified   LOS: 2 days   Jolayne Panther 09/02/2019, 8:46 AM
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
## 2019-09-03 MED ORDER — AMLODIPINE BESYLATE 5 MG PO TABS
5.0000 mg | ORAL_TABLET | Freq: Every day | ORAL | Status: DC
  Administered 2019-09-03: 5 mg via ORAL
  Filled 2019-09-03: qty 1

## 2019-09-03 MED ORDER — IBUPROFEN 600 MG PO TABS: 600.0000 mg | ORAL_TABLET | Freq: Four times a day (QID) | ORAL | 0 refills | Status: DC | PRN

## 2019-09-03 MED ORDER — OXYCODONE HCL 5 MG PO TABS: 5.0000 mg | ORAL_TABLET | ORAL | 0 refills | Status: DC | PRN

## 2019-09-03 MED ORDER — AMLODIPINE BESYLATE 5 MG PO TABS: 5.0000 mg | ORAL_TABLET | Freq: Every day | ORAL | 3 refills | Status: DC

## 2019-09-03 MED FILL — AMLODIPINE BESYLATE 5 MG TA: 5 | 30 days supply | Qty: 30 | Fill #0

## 2019-09-03 MED FILL — IBUPROFEN 600 MG TABS: 600 | 8 days supply | Qty: 30 | Fill #0

## 2019-09-03 MED FILL — oxyCODONE HCL 5 MG TABS: 5 | 3 days supply | Qty: 15 | Fill #0

## 2019-09-03 NOTE — Lactation Note (Signed)
This note was copied from a baby's chart. Lactation Consultation Note  Patient Name: Molly Zimmerman Molly Zimmerman Date: 09/03/2019 Reason for consult: Follow-up assessment Baby is 57 hours old/6% weight loss.  Mom reports that milk came in this morning.  Breasts are full but comfortable.  Instructed on prevention and treatment of engorgement.  Manual pump given with instructions.  Questions answered.  Reviewed outpatient services and encouraged to call prn.  Maternal Data    Feeding Feeding Type: Breast Fed  LATCH Score                   Interventions    Lactation Tools Discussed/Used     Consult Status Consult Status: Complete Follow-up type: Call as needed    Huston Foley 09/03/2019, 10:20 AM
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
## 2019-09-03 NOTE — Discharge Instructions (Signed)
Postpartum Hypertension Postpartum hypertension is high blood pressure that remains higher than normal after childbirth. You may not realize that you have postpartum hypertension if your blood pressure is not being checked regularly. In most cases, postpartum hypertension will go away on its own, usually within a week of delivery. However, for some women, medical treatment is required to prevent serious complications, such as seizures or stroke. What are the causes? This condition may be caused by one or more of the following:  Hypertension that existed before pregnancy (chronic hypertension).  Hypertension that comes on as a result of pregnancy (gestational hypertension).  Hypertensive disorders during pregnancy (preeclampsia) or seizures in women who have high blood pressure during pregnancy (eclampsia).  A condition in which the liver, platelets, and red blood cells are damaged during pregnancy (HELLP syndrome).  A condition in which the thyroid produces too much hormones (hyperthyroidism).  Other rare problems of the nerves (neurological disorders) or blood disorders. In some cases, the cause may not be known. What increases the risk? The following factors may make you more likely to develop this condition:  Chronic hypertension. In some cases, this may not have been diagnosed before pregnancy.  Obesity.  Type 2 diabetes.  Kidney disease.  History of preeclampsia or eclampsia.  Other medical conditions that change the level of hormones in the body (hormonal imbalance). What are the signs or symptoms? As with all types of hypertension, postpartum hypertension may not have any symptoms. Depending on how high your blood pressure is, you may experience:  Headaches. These may be mild, moderate, or severe. They may also be steady, constant, or sudden in onset (thunderclap headache).  Changes in your ability to see (visual changes).  Dizziness.  Shortness of breath.  Swelling  of your hands, feet, lower legs, or face. In some cases, you may have swelling in more than one of these locations.  Heart palpitations or a racing heartbeat.  Difficulty breathing while lying down.  Decrease in the amount of urine that you pass. Other rare signs and symptoms may include:  Sweating more than usual. This lasts longer than a few days after delivery.  Chest pain.  Sudden dizziness when you get up from sitting or lying down.  Seizures.  Nausea or vomiting.  Abdominal pain. How is this diagnosed? This condition may be diagnosed based on the results of a physical exam, blood pressure measurements, and blood and urine tests. You may also have other tests, such as a CT scan or an MRI, to check for other problems of postpartum hypertension. How is this treated? If blood pressure is high enough to require treatment, your options may include:  Medicines to reduce blood pressure (antihypertensives). Tell your health care provider if you are breastfeeding or if you plan to breastfeed. There are many antihypertensive medicines that are safe to take while breastfeeding.  Stopping medicines that may be causing hypertension.  Treating medical conditions that are causing hypertension.  Treating the complications of hypertension, such as seizures, stroke, or kidney problems. Your health care provider will also continue to monitor your blood pressure closely until it is within a safe range for you. Follow these instructions at home:  Take over-the-counter and prescription medicines only as told by your health care provider.  Return to your normal activities as told by your health care provider. Ask your health care provider what activities are safe for you.  Do not use any products that contain nicotine or tobacco, such as cigarettes and e-cigarettes. If   you need help quitting, ask your health care provider.  Keep all follow-up visits as told by your health care provider. This  is important. Contact a health care provider if:  Your symptoms get worse.  You have new symptoms, such as: ? A headache that does not get better. ? Dizziness. ? Visual changes. Get help right away if:  You suddenly develop swelling in your hands, ankles, or face.  You have sudden, rapid weight gain.  You develop difficulty breathing, chest pain, racing heartbeat, or heart palpitations.  You develop severe pain in your abdomen.  You have any symptoms of a stroke. "BE FAST" is an easy way to remember the main warning signs of a stroke: ? B - Balance. Signs are dizziness, sudden trouble walking, or loss of balance. ? E - Eyes. Signs are trouble seeing or a sudden change in vision. ? F - Face. Signs are sudden weakness or numbness of the face, or the face or eyelid drooping on one side. ? A - Arms. Signs are weakness or numbness in an arm. This happens suddenly and usually on one side of the body. ? S - Speech. Signs are sudden trouble speaking, slurred speech, or trouble understanding what people say. ? T - Time. Time to call emergency services. Write down what time symptoms started.  You have other signs of a stroke, such as: ? A sudden, severe headache with no known cause. ? Nausea or vomiting. ? Seizure. These symptoms may represent a serious problem that is an emergency. Do not wait to see if the symptoms will go away. Get medical help right away. Call your local emergency services (911 in the [place] Do not drive yourself to the hospital. Summary  Postpartum hypertension is high blood pressure that remains higher than normal after childbirth.  In most cases, postpartum hypertension will go away on its own, usually within a week of delivery.  For some women, medical treatment is required to prevent serious complications, such as seizures or stroke. This information is not intended to replace advice given to you by your health care provider. Make sure you discuss any questions  you have with your health care provider. Document Revised: 07/18/2018 Document Reviewed: 04/01/2017 Elsevier Patient Education  2020 [employer]. Laparoscopic Tubal Ligation, Care After This sheet gives you information about how to care for yourself after your procedure. Your health care provider may also give you more specific instructions. If you have problems or questions, contact your health care provider. What can I expect after the procedure? After the procedure, it is common to have:  A sore throat.  Discomfort in your shoulder.  Mild discomfort or cramping in your abdomen.  Gas pains.  Pain or soreness in the area where the surgical incision was made.  A bloated feeling.  Tiredness.  Nausea.  Vomiting. Follow these instructions at home: Medicines  Take over-the-counter and prescription medicines only as told by your health care provider.  Do not take aspirin because it can cause bleeding.  Ask your health care provider if the medicine prescribed to you: ? Requires you to avoid driving or using heavy machinery. ? Can cause constipation. You may need to take actions to prevent or treat constipation, such as:  Drink enough fluid to keep your urine pale yellow.  Take over-the-counter or prescription medicines.  Eat foods that are high in fiber, such as beans, whole grains, and fresh fruits and vegetables.  Limit foods that are high in fat and processed  sugars, such as fried or sweet foods. Incision care      Follow instructions from your health care provider about how to take care of your incision. Make sure you: ? Wash your hands with soap and water before and after you change your bandage (dressing). If soap and water are not available, use hand sanitizer. ? Change your dressing as told by your health care provider. ? Leave stitches (sutures), skin glue, or adhesive strips in place. These skin closures may need to stay in place for 2 weeks or longer. If  adhesive strip edges start to loosen and curl up, you may trim the loose edges. Do not remove adhesive strips completely unless your health care provider tells you to do that.  Check your incision area every day for signs of infection. Check for: ? Redness, swelling, or pain. ? Fluid or blood. ? Warmth. ? Pus or a bad smell. Activity  Rest as told by your health care provider.  Avoid sitting for a long time without moving. Get up to take short walks every 1-2 hours. This is important to improve blood flow and breathing. Ask for help if you feel weak or unsteady.  Return to your normal activities as told by your health care provider. Ask your health care provider what activities are safe for you. General instructions  Do not take baths, swim, or use a hot tub until your health care provider approves. Ask your health care provider if you may take showers. You may only be allowed to take sponge baths.  Have someone help you with your daily household tasks for the first few days.  Keep all follow-up visits as told by your health care provider. This is important. Contact a health care provider if:  You have redness, swelling, or pain around your incision.  Your incision feels warm to the touch.  You have pus or a bad smell coming from your incision.  The edges of your incision break open after the sutures have been removed.  Your pain does not improve after 2-3 days.  You have a rash.  You repeatedly become dizzy or light-headed.  Your pain medicine is not helping. Get help right away if you:  Have a fever.  Faint.  Have increasing pain in your abdomen.  Have severe pain in one or both of your shoulders.  Have fluid or blood coming from your sutures or from your vagina.  Have shortness of breath or difficulty breathing.  Have chest pain or leg pain.  Have ongoing nausea, vomiting, or diarrhea. Summary  After the procedure, it is common to have mild discomfort or  cramping in your abdomen.  Take over-the-counter and prescription medicines only as told by your health care provider.  Watch for symptoms that should prompt you to call your health care provider.  Keep all follow-up visits as told by your health care provider. This is important. This information is not intended to replace advice given to you by your health care provider. Make sure you discuss any questions you have with your health care provider. Document Revised: 11/18/2018 Document Reviewed: 05/06/2018 Elsevier Patient Education  2020 Else[employer] Inc. Postpartum Care After Vaginal Delivery This sheet gives you information about how to care for yourself from the time you deliver your baby to up to 6-12 weeks after delivery (postpartum period). Your health care provider may also give you more specific instructions. If you have problems or questions, contact your health care provider. Follow these instructions at home:  Vaginal bleeding  It is normal to have vaginal bleeding (lochia) after delivery. Wear a sanitary pad for vaginal bleeding and discharge. ? During the first week after delivery, the amount and appearance of lochia is often similar to a menstrual period. ? Over the next few weeks, it will gradually decrease to a dry, yellow-brown discharge. ? For most women, lochia stops completely by 4-6 weeks after delivery. Vaginal bleeding can vary from woman to woman.  Change your sanitary pads frequently. Watch for any changes in your flow, such as: ? A sudden increase in volume. ? A change in color. ? Large blood clots.  If you pass a blood clot from your vagina, save it and call your health care provider to discuss. Do not flush blood clots down the toilet before talking with your health care provider.  Do not use tampons or douches until your health care provider says this is safe.  If you are not breastfeeding, your period should return 6-8 weeks after delivery. If you are feeding  your child breast milk only (exclusive breastfeeding), your period may not return until you stop breastfeeding. Perineal care  Keep the area between the vagina and the anus (perineum) clean and dry as told by your health care provider. Use medicated pads and pain-relieving sprays and creams as directed.  If you had a cut in the perineum (episiotomy) or a tear in the vagina, check the area for signs of infection until you are healed. Check for: ? More redness, swelling, or pain. ? Fluid or blood coming from the cut or tear. ? Warmth. ? Pus or a bad smell.  You may be given a squirt bottle to use instead of wiping to clean the perineum area after you go to the bathroom. As you start healing, you may use the squirt bottle before wiping yourself. Make sure to wipe gently.  To relieve pain caused by an episiotomy, a tear in the vagina, or swollen veins in the anus (hemorrhoids), try taking a warm sitz bath 2-3 times a day. A sitz bath is a warm water bath that is taken while you are sitting down. The water should only come up to your hips and should cover your buttocks. Breast care  Within the first few days after delivery, your breasts may feel heavy, full, and uncomfortable (breast engorgement). Milk may also leak from your breasts. Your health care provider can suggest ways to help relieve the discomfort. Breast engorgement should go away within a few days.  If you are breastfeeding: ? Wear a bra that supports your breasts and fits you well. ? Keep your nipples clean and dry. Apply creams and ointments as told by your health care provider. ? You may need to use breast pads to absorb milk that leaks from your breasts. ? You may have uterine contractions every time you breastfeed for up to several weeks after delivery. Uterine contractions help your uterus return to its normal size. ? If you have any problems with breastfeeding, work with your health care provider or Science writer.  If  you are not breastfeeding: ? Avoid touching your breasts a lot. Doing this can make your breasts produce more milk. ? Wear a good-fitting bra and use cold packs to help with swelling. ? Do not squeeze out (express) milk. This causes you to make more milk. Intimacy and sexuality  Ask your health care provider when you can engage in sexual activity. This may depend on: ? Your risk of infection. ?  How fast you are healing. ? Your comfort and desire to engage in sexual activity.  You are able to get pregnant after delivery, even if you have not had your period. If desired, talk with your health care provider about methods of birth control (contraception). Medicines  Take over-the-counter and prescription medicines only as told by your health care provider.  If you were prescribed an antibiotic medicine, take it as told by your health care provider. Do not stop taking the antibiotic even if you start to feel better. Activity  Gradually return to your normal activities as told by your health care provider. Ask your health care provider what activities are safe for you.  Rest as much as possible. Try to rest or take a nap while your baby is sleeping. Eating and drinking   Drink enough fluid to keep your urine pale yellow.  Eat high-fiber foods every day. These may help prevent or relieve constipation. High-fiber foods include: ? Whole grain cereals and breads. ? Brown rice. ? Beans. ? Fresh fruits and vegetables.  Do not try to lose weight quickly by cutting back on calories.  Take your prenatal vitamins until your postpartum checkup or until your health care provider tells you it is okay to stop. Lifestyle  Do not use any products that contain nicotine or tobacco, such as cigarettes and e-cigarettes. If you need help quitting, ask your health care provider.  Do not drink alcohol, especially if you are breastfeeding. General instructions  Keep all follow-up visits for you and your  baby as told by your health care provider. Most women visit their health care provider for a postpartum checkup within the first 3-6 weeks after delivery. Contact a health care provider if:  You feel unable to cope with the changes that your child brings to your life, and these feelings do not go away.  You feel unusually sad or worried.  Your breasts become red, painful, or hard.  You have a fever.  You have trouble holding urine or keeping urine from leaking.  You have little or no interest in activities you used to enjoy.  You have not breastfed at all and you have not had a menstrual period for 12 weeks after delivery.  You have stopped breastfeeding and you have not had a menstrual period for 12 weeks after you stopped breastfeeding.  You have questions about caring for yourself or your baby.  You pass a blood clot from your vagina. Get help right away if:  You have chest pain.  You have difficulty breathing.  You have sudden, severe leg pain.  You have severe pain or cramping in your lower abdomen.  You bleed from your vagina so much that you fill more than one sanitary pad in one hour. Bleeding should not be heavier than your heaviest period.  You develop a severe headache.  You faint.  You have blurred vision or spots in your vision.  You have bad-smelling vaginal discharge.  You have thoughts about hurting yourself or your baby. If you ever feel like you may hurt yourself or others, or have thoughts about taking your own life, get help right away. You can go to the nearest emergency department or call:  Your local emergency services (911 in the [place]  A suicide crisis helpline, such as the National Suicide Prevention Lifeline at 725-176-2486. This is open 24 hours a day. Summary  The period of time right after you deliver your newborn up to 6-12 weeks after  delivery is called the postpartum period.  Gradually return to your normal activities as told by  your health care provider.  Keep all follow-up visits for you and your baby as told by your health care provider. This information is not intended to replace advice given to you by your health care provider. Make sure you discuss any questions you have with your health care provider. Document Revised: 06/14/2017 Document Reviewed: 03/25/2017 Elsevier Patient Education  2020 [employer].
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
## 2019-09-08 ENCOUNTER — Telehealth: Payer: Medicaid Other

## 2019-09-14 ENCOUNTER — Other Ambulatory Visit: Payer: Self-pay

## 2019-09-14 ENCOUNTER — Ambulatory Visit (INDEPENDENT_AMBULATORY_CARE_PROVIDER_SITE_OTHER): Payer: Medicaid Other | Admitting: Advanced Practice Midwife

## 2019-09-14 VITALS — BP 117/63 | HR 81 | Ht 64.0 in | Wt 285.6 lb

## 2019-09-14 DIAGNOSIS — O8612 Endometritis following delivery: Secondary | ICD-10-CM

## 2019-09-14 MED ORDER — AMOXICILLIN-POT CLAVULANATE 500-125 MG PO TABS: 1.0000 | ORAL_TABLET | Freq: Three times a day (TID) | ORAL | 0 refills | Status: DC

## 2019-09-14 NOTE — Progress Notes (Signed)
[facility] Visit  Patient name: Molly Zimmerman MRN [id number]  Date of birth: [birth year]  CC & HPI:  Molly Zimmerman is a 24 y.o. Caucasian female presenting today for vaginal odor and discharge.  Delivered 2 weeks ago.  Over the past several days, discharge has become increased and very foul. Had endometritis last delivery and feels like it's the same.  No fever, occ low abd pain.  Is pumping milk. No sex since delivery  Pertinent History Reviewed:  Medical & Surgical Hx:   Past Medical History:  Diagnosis Date  . Anxiety   . Bipolar 1 disorder ([place])   . Endometriosis   . History of suicide attempt   . Mood disorder ([place])   . Obesity   . ODD (oppositional defiant disorder)   . Polysubstance abuse [facility])    Past Surgical History:  Procedure Laterality Date  . ADENOIDECTOMY  Age 39  . TONSILLECTOMY AND ADENOIDECTOMY  Age5  . TUBAL LIGATION Bilateral 09/01/2019   Procedure: POST PARTUM TUBAL LIGATION;  Surgeon: Mora Bellman, MD;  Location: [facility];  Service: Gynecology;  Laterality: Bilateral;   Family History  Problem Relation Age of Onset  . ADD / ADHD Father   . Drug abuse Father   . Diabetes Maternal Grandfather   . Heart disease Paternal Grandfather   . Cancer Paternal Grandfather        MGM sister  . Heart failure Paternal Grandfather   . Drug abuse Paternal Grandmother     Current Outpatient Medications:  .  amLODipine (NORVASC) 5 MG tablet, Take 1 tablet (5 mg total) by mouth daily., Disp: 30 tablet, Rfl: 3 .  ibuprofen (ADVIL) 600 MG tablet, Take 1 tablet (600 mg total) by mouth every 6 (six) hours as needed., Disp: 30 tablet, Rfl: 0 .  LATUDA 20 MG TABS tablet, Take 20 mg by mouth every morning. , Disp: , Rfl:  .  LATUDA 40 MG TABS tablet, Take 40 mg by mouth at bedtime. , Disp: , Rfl:  .  amoxicillin-clavulanate (AUGMENTIN) 500-125 MG tablet, Take 1 tablet (500 mg total) by mouth 3 (three) times daily., Disp: 21 tablet, Rfl: 0 .   oxyCODONE (OXY IR/ROXICODONE) 5 MG immediate release tablet, Take 1 tablet (5 mg total) by mouth every 4 (four) hours as needed for severe pain. (Patient not taking: Reported on 09/14/2019), Disp: 15 tablet, Rfl: 0 .  Prenat w/o A-[id number]-[id number]-DSS-FA (CITRANATAL RX) 27-1 MG TABS, Take 1 tablet by mouth daily. (Patient not taking: Reported on 09/14/2019), Disp: 30 tablet, Rfl: 6 Social History: Reviewed -  reports that she has quit smoking. Her smoking use included cigarettes. She smoked 0.25 packs per day. She has never used smokeless tobacco.  Review of Systems:   Constitutional: Negative for fever and chills Eyes: Negative for visual disturbances Respiratory: Negative for shortness of breath, dyspnea Cardiovascular: Negative for chest pain or palpitations  Gastrointestinal: Negative for vomiting, diarrhea and constipation; no abdominal pain Genitourinary: Negative for dysuria and urgency, vaginal irritation or itching Musculoskeletal: Negative for back pain, joint pain, myalgias  Neurological: Negative for dizziness and headaches    Objective Findings:    Physical Examination: Vitals:   09/14/19 1521  BP: 117/63  Pulse: 81   General appearance - well appearing, and in no distress Mental status - alert, oriented to person, place, and time Chest:  Normal respiratory effort Heart - normal rate and regular rhythm Abdomen:  Soft, nontender Pelvic: SSE:  Frothy yellow DC,  foul odor Musculoskeletal:  Normal range of motion without pain Extremities:  No edema    No results found for this or any previous visit (from the past 24 hour(s)).    Assessment & Plan:  A:   endometritis P:  augmentin   Return for As scheduled.  Jacklyn Shell CNM 09/14/2019 3:31 PM

## 2019-10-06 ENCOUNTER — Other Ambulatory Visit: Payer: Self-pay

## 2019-10-06 ENCOUNTER — Encounter: Payer: Medicaid Other | Admitting: Women's Health

## 2019-10-06 ENCOUNTER — Encounter: Payer: Self-pay | Admitting: Women's Health

## 2019-10-06 ENCOUNTER — Encounter: Payer: Self-pay | Admitting: *Deleted

## 2019-10-06 NOTE — Progress Notes (Signed)
Pt for mychart visit, wasn't in 'room', nurse called pt back twice and sent mychart messages.  Cheral Marker, CNM, [facility] 10/06/19 This encounter was created in error - please disregard.

## 2019-11-04 ENCOUNTER — Other Ambulatory Visit: Payer: Self-pay

## 2019-11-04 ENCOUNTER — Encounter: Payer: Self-pay | Admitting: Obstetrics and Gynecology

## 2019-11-04 ENCOUNTER — Other Ambulatory Visit (HOSPITAL_COMMUNITY)
Admission: RE | Admit: 2019-11-04 | Discharge: 2019-11-04 | Disposition: A | Discharge status: Home / Self Care | Payer: Medicaid Other | Source: Ambulatory Visit | Attending: Obstetrics and Gynecology | Admitting: Obstetrics and Gynecology

## 2019-11-04 ENCOUNTER — Ambulatory Visit (INDEPENDENT_AMBULATORY_CARE_PROVIDER_SITE_OTHER): Payer: Medicaid Other | Admitting: Obstetrics and Gynecology

## 2019-11-04 VITALS — BP 117/72 | HR 81 | Ht 64.0 in | Wt 281.0 lb

## 2019-11-04 DIAGNOSIS — N898 Other specified noninflammatory disorders of vagina: Secondary | ICD-10-CM

## 2019-11-04 LAB — POCT WET PREP (WET MOUNT): WBC, Wet Prep HPF POC: NEGATIVE

## 2019-11-04 MED ORDER — METRONIDAZOLE 500 MG PO TABS: 500.0000 mg | ORAL_TABLET | Freq: Two times a day (BID) | ORAL | 0 refills | Status: AC

## 2019-11-04 NOTE — Progress Notes (Signed)
POSTPARTUM VISIT PATIENT NAME: Molly Zimmerman     MRN [id number]     DOB: Feb 23, 1996 Chief Complaint:   No chief complaint on file.  History of Present Illness:   Molly Zimmerman is a 24 y.o. G20P3003 Caucasian female being seen today for a postpartum visit. She is 9 weeks postpartum following a spontaneous vaginal delivery at [redacted]w[redacted]d gestational weeks. Anesthesia: epidural.  I have  reviewed the prenatal and intrapartum course. Pregnancy uncomplicated. Postpartum course has been complicated by endometritis . Bleeding no bleeding. Bowel function is normal. Bladder function is normal.  Patient is sexually active. Last sexual activity: last week with ex..   She notes that she is still experiencing significant malodorous discharge. She notes that her ex-partner have separated and that he has been polyamorous.   She is currently seeing a psychiatrist to help with her mood. She plans to discuss her mood and her current social challenges with them at that appointment.   No LMP recorded.  Baby's course has been uncomplicated. Baby is feeding by breast & bottle    Inocente Salles Postpartum Depression Screening: negative  Review of Systems:   Pertinent items are noted in HPI Denies Abnormal vaginal discharge w/ itching/odor/irritation, headaches, visual changes, shortness of breath, chest pain, abdominal pain, severe nausea/vomiting, or problems with urination or bowel movements.  Pertinent History Reviewed:  Reviewed past medical,surgical, obstetrical and family history.  Reviewed problem list, medications and allergies. OB History  Gravida Para Term Preterm AB Living  3 3 3     3   SAB TAB Ectopic Multiple Live Births        0 3    # Outcome Date GA Lbr Len/2nd Weight Sex Delivery Anes PTL Lv  3 Term 09/01/19 [redacted]w[redacted]d 05:44 / 00:06 9 lb 2.2 oz (4.145 kg) M Vag-Spont EPI  LIV  2 Term 02/19/16 [redacted]w[redacted]d  9 lb 11 oz (4.394 kg) F Vag-Spont EPI N LIV  1 Term 03/14/14 [redacted]w[redacted]d  8 lb 9 oz (3.884  kg) F Vag-Spont EPI N LIV    Physical Assessment:  There were no vitals filed for this visit.There is no height or weight on file to calculate BMI.       Physical Examination:   General appearance: alert, well appearing, and in no distress, oriented to person, place, and time, overweight and well hydrated  Mental status: alert, oriented to person, place, and time, normal mood, behavior, speech, dress, motor activity, and thought processes, affect appropriate to mood  Skin: warm & dry   Cardiovascular: normal heart rate noted   Respiratory: normal respiratory effort, no distress   Breasts: deferred, no complaints   Abdomen: soft, non-tender   Pelvic: normal external genitalia, vulva, vagina, cervix, uterus and adnexa, VAGINA: vaginal discharge - copious, creamy, frothy and milky. Microscopic exam revealed trichomonas clue cells.  Rectal: no hemorrhoids  Extremities: minimal edema       No results found for this or any previous visit (from the past 24 hour(s)).   Assessment & Plan:  1) Postpartum exam 2) 5  wks s/p  svd with lac 3) breast bottlefeeding 4) Depression screening 5) Contraception counseling   Essential components of care per ACOG recommendations:  1.  Mood and well being: Patient with negative depression screening today. Reviewed local resources for support.  . Patient use tobacco.  . H/O drug use? Yes   If yes, discussed support systems  2. Infant care and feeding:  3. 5. Physical Recovery  . Discussed  patients delivery and complications . Patient had a 2nd degree, perineal healing reviewed. Patient expressed understanding . Patient has urinary incontinence? No  . Patient is not safe to resume physical and sexual activity Get partner tx'd . 6.   7. Chronic Disease . PCP follow up   Meds: metronidazole  Follow-up: No follow-ups on file.   No orders of the defined types were placed in this encounter.     By signing my name below, I, [employer],  attest that this documentation has been prepared under the direction and in the presence of Jonnie Kind, MD. Electronically Signed: [place]. 11/04/19. 11:25 AM.  I personally performed the services described in this documentation, which was SCRIBED in my presence. The recorded information has been reviewed and considered accurate. It has been edited as necessary during review. Jonnie Kind, MD ]

## 2019-11-05 LAB — CERVICOVAGINAL ANCILLARY ONLY
Chlamydia: NEGATIVE
Comment: NEGATIVE
Comment: NORMAL
Neisseria Gonorrhea: NEGATIVE

## 2019-11-06 NOTE — Progress Notes (Signed)
Negative gc and CHL

## 2020-03-07 ENCOUNTER — Other Ambulatory Visit: Payer: Self-pay

## 2020-03-07 ENCOUNTER — Emergency Department (HOSPITAL_COMMUNITY)
Admission: EM | Admit: 2020-03-07 | Discharge: 2020-03-07 | Disposition: A | Discharge status: Home / Self Care | Payer: Medicaid Other | Attending: Emergency Medicine | Admitting: Emergency Medicine

## 2020-03-07 ENCOUNTER — Encounter (HOSPITAL_COMMUNITY): Payer: Self-pay

## 2020-03-07 DIAGNOSIS — F17210 Nicotine dependence, cigarettes, uncomplicated: Secondary | ICD-10-CM | POA: Insufficient documentation

## 2020-03-07 DIAGNOSIS — U071 COVID-19: Secondary | ICD-10-CM | POA: Insufficient documentation

## 2020-03-07 DIAGNOSIS — R519 Headache, unspecified: Secondary | ICD-10-CM

## 2020-03-07 LAB — SARS CORONAVIRUS 2 BY RT PCR (HOSPITAL ORDER, PERFORMED IN ~~LOC~~ HOSPITAL LAB): SARS Coronavirus 2: POSITIVE — AB

## 2020-03-07 MED ORDER — IBUPROFEN 800 MG PO TABS: 800.0000 mg | ORAL_TABLET | Freq: Three times a day (TID) | ORAL | 0 refills | Status: AC | PRN

## 2020-03-07 MED ORDER — HYDROCODONE-ACETAMINOPHEN 5-325 MG PO TABS
2.0000 | ORAL_TABLET | Freq: Once | ORAL | Status: AC
  Administered 2020-03-07: 2 via ORAL
  Filled 2020-03-07: qty 2

## 2020-03-07 NOTE — Discharge Instructions (Addendum)
Your covid test is pending. Your test should return in the next 2-3 hours

## 2020-03-07 NOTE — ED Provider Notes (Signed)
[facility] EMERGENCY DEPARTMENT Provider Note   CSN: [id number] Arrival date & time: 03/07/20  1062     History Chief Complaint  Patient presents with  . Headache    Molly Zimmerman is a 24 y.o. female.  The history is provided by the patient. No language interpreter was used.  Headache Pain location:  Frontal Quality:  Sharp Radiates to:  Does not radiate Severity currently:  6/10 Onset quality:  Gradual Timing:  Constant Progression:  Worsening Chronicity:  New Context: not coughing   Relieved by:  Nothing Worsened by:  Nothing Ineffective treatments:  None tried Associated symptoms: facial pain   Risk factors: does not have insomnia   Pt complains of left forehead pain.  Pt feels like she has sinus congestion      Past Medical History:  Diagnosis Date  . Anxiety   . Bipolar 1 disorder (HCC)   . Endometriosis   . History of suicide attempt   . Mood disorder (HCC)   . Obesity   . ODD (oppositional defiant disorder)   . Polysubstance abuse [facility])     Patient Active Problem List   Diagnosis Date Noted  . Gestational hypertension 08/31/2019  . Obesity affecting pregnancy, antepartum 07/23/2019  . Request for sterilization 06/24/2019  . Morbid obesity (HCC) 07/10/2017  . History of suicide attempt 12/03/2013  . Marijuana use 10/13/2013  . Polysubstance abuse (HCC) 09/26/2012  . Oppositional defiant behavior 09/24/2012  . Bipolar 1 disorder, mixed, moderate (HCC) 09/24/2012    Past Surgical History:  Procedure Laterality Date  . ADENOIDECTOMY  Age 89  . TONSILLECTOMY AND ADENOIDECTOMY  Age5  . TUBAL LIGATION Bilateral 09/01/2019   Procedure: POST PARTUM TUBAL LIGATION;  Surgeon: Catalina Antigua, MD;  Location: [facility] LD ORS;  Service: Gynecology;  Laterality: Bilateral;     OB History    Gravida  3   Para  3   Term  3   Preterm      AB      Living  3     SAB      TAB      Ectopic      Multiple  0   Live Births  3           Family  History  Problem Relation Age of Onset  . ADD / ADHD Father   . Drug abuse Father   . Diabetes Maternal Grandfather   . Heart disease Paternal Grandfather   . Cancer Paternal Grandfather        MGM sister  . Heart failure Paternal Grandfather   . Drug abuse Paternal Grandmother     Social History   Tobacco Use  . Smoking status: Current Every Day Smoker    Packs/day: 0.25    Types: Cigarettes  . Smokeless tobacco: Never Used  Vaping Use  . Vaping Use: Every day  . Substances: Nicotine  Substance Use Topics  . Alcohol use: Not Currently    Comment: socially   . Drug use: No    Types: Marijuana    Comment: last used 06/27/17    Home Medications Prior to Admission medications   Medication Sig Start Date End Date Taking? Authorizing Provider  ibuprofen (ADVIL) 800 MG tablet Take 1 tablet (800 mg total) by mouth every 8 (eight) hours as needed. 03/07/20   Elson Areas, PA-C  LATUDA 20 MG TABS tablet Take 20 mg by mouth every morning.  12/18/18   [provider]  LATUDA  40 MG TABS tablet Take 40 mg by mouth at bedtime.  10/23/18   [provider]    Allergies    Patient has no known allergies.  Review of Systems   Review of Systems  Neurological: Positive for headaches.  All other systems reviewed and are negative.   Physical Exam Updated Vital Signs BP 120/68   Pulse 63   Temp 98.5 F (36.9 C) (Oral)   Resp 18   Ht 5\' 4"  (1.626 m)   Wt 119.7 kg   LMP 03/05/2020   SpO2 99%   BMI 45.32 kg/m   Physical Exam Vitals and nursing note reviewed.  Constitutional:      Appearance: She is well-developed.  HENT:     Head: Normocephalic.  Eyes:     Extraocular Movements: Extraocular movements intact.  Cardiovascular:     Rate and Rhythm: Normal rate and regular rhythm.     Heart sounds: Normal heart sounds.  Pulmonary:     Effort: Pulmonary effort is normal.  Abdominal:     General: There is no distension.     Palpations: Abdomen is soft.   Musculoskeletal:        General: Normal range of motion.     Cervical back: Normal range of motion.  Neurological:     Mental Status: She is alert and oriented to person, place, and time.  Psychiatric:        Mood and Affect: Mood normal.     ED Results / Procedures / Treatments   Labs (all labs ordered are listed, but only abnormal results are displayed) Labs Reviewed  SARS CORONAVIRUS 2 BY RT PCR (HOSPITAL ORDER, PERFORMED IN [facility]) - Abnormal; Notable for the following components:      Result Value   SARS Coronavirus 2 POSITIVE (*)    All other components within normal limits    EKG None  Radiology No results found.  Procedures Procedures (including critical care time)  Medications Ordered in ED Medications  HYDROcodone-acetaminophen (NORCO/VICODIN) 5-325 MG per tablet 2 tablet (2 tablets Oral Given 03/07/20 1140)    ED Course  I have reviewed the triage vital signs and the nursing notes.  Pertinent labs & imaging results that were available during my care of the patient were reviewed by me and considered in my medical decision making (see chart for details).    MDM Rules/Calculators/A&P                         MDM: Pt given hydrocodone x 2.  Pt reports headache has improved. I suspect pt has covid.  Pt counseled on covid illness Final Clinical Impression(s) / ED Diagnoses Final diagnoses:  Bad headache    Rx / DC Orders ED Discharge Orders         Ordered    ibuprofen (ADVIL) 800 MG tablet  Every 8 hours PRN        03/07/20 1223        An After Visit Summary was printed and given to the patient.    03/09/20, Elson Areas 03/07/20 1840    03/09/20, MD 03/09/20 1247

## 2020-03-07 NOTE — ED Triage Notes (Signed)
Pt presents to ED with complaints of headache, worse on the left side x 3 days with nausea.

## 2020-03-08 ENCOUNTER — Telehealth (HOSPITAL_COMMUNITY): Payer: Self-pay | Admitting: Nurse Practitioner

## 2020-03-08 DIAGNOSIS — U071 COVID-19: Secondary | ICD-10-CM

## 2020-03-08 NOTE — Telephone Encounter (Signed)
Called to discuss with Molly Zimmerman about Covid symptoms and the use of regeneron, a monoclonal antibody infusion for those with mild to moderate Covid symptoms and at a high risk of hospitalization.    Pt does not qualify for infusion therapy given symptoms first presented > 10 days prior to timing of infusion. Symptoms tier reviewed as well as criteria for ending isolation. Preventative practices reviewed. Patient verbalized understanding. Discussed vaccination for patient as well as children.    Patient Active Problem List   Diagnosis Date Noted  . Gestational hypertension 08/31/2019  . Obesity affecting pregnancy, antepartum 07/23/2019  . Request for sterilization 06/24/2019  . Morbid obesity (HCC) 07/10/2017  . History of suicide attempt 12/03/2013  . Marijuana use 10/13/2013  . Polysubstance abuse (HCC) 09/26/2012  . Oppositional defiant behavior 09/24/2012  . Bipolar 1 disorder, mixed, moderate (HCC) 09/24/2012     [name], NP [facility]  [phone] [email or web address]

## 2020-04-01 ENCOUNTER — Other Ambulatory Visit (HOSPITAL_COMMUNITY): Payer: Self-pay | Admitting: Family Medicine

## 2020-04-01 ENCOUNTER — Other Ambulatory Visit: Payer: Self-pay | Admitting: Family Medicine

## 2020-04-01 DIAGNOSIS — H9312 Tinnitus, left ear: Secondary | ICD-10-CM

## 2020-04-04 ENCOUNTER — Other Ambulatory Visit: Payer: Self-pay

## 2020-04-04 ENCOUNTER — Encounter (HOSPITAL_COMMUNITY): Payer: Self-pay

## 2020-04-04 ENCOUNTER — Ambulatory Visit (HOSPITAL_COMMUNITY)
Admission: RE | Admit: 2020-04-04 | Discharge: 2020-04-04 | Disposition: A | Discharge status: Home / Self Care | Payer: Medicaid Other | Source: Ambulatory Visit | Attending: Family Medicine | Admitting: Family Medicine

## 2020-04-04 DIAGNOSIS — H9312 Tinnitus, left ear: Secondary | ICD-10-CM

## 2020-05-18 ENCOUNTER — Encounter: Payer: Self-pay | Admitting: *Deleted

## 2020-05-24 ENCOUNTER — Other Ambulatory Visit: Payer: Medicaid Other | Admitting: Women's Health

## 2020-06-01 ENCOUNTER — Encounter: Payer: Self-pay | Admitting: General Practice

## 2020-06-22 ENCOUNTER — Other Ambulatory Visit: Payer: Medicaid Other

## 2020-07-14 ENCOUNTER — Emergency Department (HOSPITAL_COMMUNITY)
Admission: EM | Admit: 2020-07-14 | Discharge: 2020-07-14 | Discharge status: Against Medical Advice | Payer: Medicaid Other

## 2020-07-14 ENCOUNTER — Other Ambulatory Visit: Payer: Self-pay

## 2020-10-31 ENCOUNTER — Encounter (HOSPITAL_COMMUNITY): Payer: Self-pay | Admitting: *Deleted

## 2020-10-31 ENCOUNTER — Emergency Department (HOSPITAL_COMMUNITY)
Admission: EM | Admit: 2020-10-31 | Discharge: 2020-10-31 | Disposition: A | Discharge status: Home / Self Care | Payer: Medicaid - Out of State | Attending: Emergency Medicine | Admitting: Emergency Medicine

## 2020-10-31 ENCOUNTER — Other Ambulatory Visit: Payer: Self-pay

## 2020-10-31 ENCOUNTER — Emergency Department (HOSPITAL_COMMUNITY): Payer: Medicaid - Out of State

## 2020-10-31 DIAGNOSIS — R110 Nausea: Secondary | ICD-10-CM | POA: Diagnosis not present

## 2020-10-31 DIAGNOSIS — R1010 Upper abdominal pain, unspecified: Secondary | ICD-10-CM | POA: Diagnosis present

## 2020-10-31 DIAGNOSIS — F17210 Nicotine dependence, cigarettes, uncomplicated: Secondary | ICD-10-CM | POA: Diagnosis not present

## 2020-10-31 DIAGNOSIS — R197 Diarrhea, unspecified: Secondary | ICD-10-CM | POA: Diagnosis not present

## 2020-10-31 DIAGNOSIS — R1084 Generalized abdominal pain: Secondary | ICD-10-CM | POA: Diagnosis not present

## 2020-10-31 LAB — COMPREHENSIVE METABOLIC PANEL
ALT: 13 U/L (ref 0–44)
AST: 14 U/L — ABNORMAL LOW (ref 15–41)
Albumin: 4.1 g/dL (ref 3.5–5.0)
Alkaline Phosphatase: 85 U/L (ref 38–126)
Anion gap: 8 (ref 5–15)
BUN: 10 mg/dL (ref 6–20)
CO2: 26 mmol/L (ref 22–32)
Calcium: 8.8 mg/dL — ABNORMAL LOW (ref 8.9–10.3)
Chloride: 103 mmol/L (ref 98–111)
Creatinine, Ser: 0.68 mg/dL (ref 0.44–1.00)
GFR, Estimated: >60 mL/min (ref >60)
Glucose, Bld: 90 mg/dL (ref 70–99)
Potassium: 3.9 mmol/L (ref 3.5–5.1)
Sodium: 137 mmol/L (ref 135–145)
Total Bilirubin: 0.6 mg/dL (ref 0.3–1.2)
Total Protein: 7.7 g/dL (ref 6.5–8.1)

## 2020-10-31 LAB — CBC WITH DIFFERENTIAL/PLATELET
Abs Immature Granulocytes: 0.03 10*3/uL (ref 0.00–0.07)
Basophils Absolute: 0 10*3/uL (ref 0.0–0.1)
Basophils Relative: 0 %
Eosinophils Absolute: 0.1 10*3/uL (ref 0.0–0.5)
Eosinophils Relative: 1 %
HCT: 40.9 % (ref 36.0–46.0)
Hemoglobin: 13.4 g/dL (ref 12.0–15.0)
Immature Granulocytes: 0 %
Lymphocytes Relative: 24 %
Lymphs Abs: 2.3 10*3/uL (ref 0.7–4.0)
MCH: 28.5 pg (ref 26.0–34.0)
MCHC: 32.8 g/dL (ref 30.0–36.0)
MCV: 87 fL (ref 80.0–100.0)
Monocytes Absolute: 0.5 10*3/uL (ref 0.1–1.0)
Monocytes Relative: 5 %
Neutro Abs: 6.7 10*3/uL (ref 1.7–7.7)
Neutrophils Relative %: 70 %
Platelets: 250 10*3/uL (ref 150–400)
RBC: 4.7 MIL/uL (ref 3.87–5.11)
RDW: 12.6 % (ref 11.5–15.5)
WBC: 9.5 10*3/uL (ref 4.0–10.5)
nRBC: 0 % (ref 0.0–0.2)

## 2020-10-31 LAB — URINALYSIS, ROUTINE W REFLEX MICROSCOPIC
Bacteria, UA: NONE SEEN
Bilirubin Urine: NEGATIVE
Glucose, UA: NEGATIVE mg/dL
Hgb urine dipstick: NEGATIVE
Ketones, ur: NEGATIVE mg/dL
Nitrite: NEGATIVE
Protein, ur: NEGATIVE mg/dL
Specific Gravity, Urine: 1.021 (ref 1.005–1.030)
pH: 5 (ref 5.0–8.0)

## 2020-10-31 LAB — LIPASE, BLOOD: Lipase: 21 U/L (ref 11–51)

## 2020-10-31 LAB — PREGNANCY, URINE: Preg Test, Ur: NEGATIVE

## 2020-10-31 MED ORDER — IOHEXOL 300 MG/ML  SOLN
100.0000 mL | Freq: Once | INTRAMUSCULAR | Status: AC | PRN
  Administered 2020-10-31: 100 mL via INTRAVENOUS

## 2020-10-31 MED ORDER — DICYCLOMINE HCL 10 MG/ML IM SOLN
10.0000 mg | Freq: Once | INTRAMUSCULAR | Status: AC
  Administered 2020-10-31: 10 mg via INTRAMUSCULAR
  Filled 2020-10-31: qty 2

## 2020-10-31 MED ORDER — ONDANSETRON 4 MG PO TBDP: 4.0000 mg | ORAL_TABLET | Freq: Three times a day (TID) | ORAL | 0 refills | Status: AC | PRN

## 2020-10-31 MED ORDER — ONDANSETRON HCL 4 MG/2ML IJ SOLN
4.0000 mg | Freq: Once | INTRAMUSCULAR | Status: AC
  Administered 2020-10-31: 4 mg via INTRAVENOUS
  Filled 2020-10-31: qty 2

## 2020-10-31 MED ORDER — MORPHINE SULFATE (PF) 4 MG/ML IV SOLN
4.0000 mg | Freq: Once | INTRAVENOUS | Status: AC
  Administered 2020-10-31: 4 mg via INTRAVENOUS
  Filled 2020-10-31: qty 1

## 2020-10-31 MED ORDER — DICYCLOMINE HCL 20 MG PO TABS: 20.0000 mg | ORAL_TABLET | Freq: Two times a day (BID) | ORAL | 0 refills | Status: AC

## 2020-10-31 NOTE — ED Provider Notes (Addendum)
[facility] EMERGENCY DEPARTMENT Provider Note   CSN: [id number] Arrival date & time: 10/31/20  1913     History Chief Complaint  Patient presents with  . Abdominal Pain    Molly PuntHaley Zimmerman is a 25 y.o. female who presents with concern for severe abdominal pain.  Patient states she has had crampy abdominal pain intermittently for the last couple of years, with associated consistently loose stools.  She says she is on talk with her PCP about it as she was embarrassed of the symptoms.  She states that she has a follow-up in 1 week with her primary care doctor and was planning to address this issue, however the pain today became more severe, she compares the pain to labor pains across her upper abdomen.  She endorses associated nausea but denies any vomiting.  She has loose stools at baseline but denies any melena or hematochezia.  Denies any fevers or chills at home.  She is sexually active but has had her tubes tied.  She denies any vaginal bleeding or discharge, denies any pelvic pain.  Patient does have history of endometriosis, as well as anxiety and depression.  She denies any excessive NSAID use, states she primarily takes Tylenol as needed for headaches.  LMP 10/17/2020. Hx of tubal ligation. HPI     Past Medical History:  Diagnosis Date  . Anxiety   . Bipolar 1 disorder (HCC)   . Endometriosis   . History of suicide attempt   . Mood disorder (HCC)   . Obesity   . ODD (oppositional defiant disorder)   . Polysubstance abuse [facility](HCC)     Patient Active Problem List   Diagnosis Date Noted  . Gestational hypertension 08/31/2019  . Obesity affecting pregnancy, antepartum 07/23/2019  . Request for sterilization 06/24/2019  . Morbid obesity (HCC) 07/10/2017  . History of suicide attempt 12/03/2013  . Marijuana use 10/13/2013  . Polysubstance abuse (HCC) 09/26/2012  . Oppositional defiant behavior 09/24/2012  . Bipolar 1 disorder, mixed, moderate (HCC) 09/24/2012    Past Surgical  History:  Procedure Laterality Date  . ADENOIDECTOMY  Age 25  . TONSILLECTOMY AND ADENOIDECTOMY  Age5  . TUBAL LIGATION Bilateral 09/01/2019   Procedure: POST PARTUM TUBAL LIGATION;  Surgeon: Catalina Antiguaonstant, Peggy, MD;  Location: [facility] LD ORS;  Service: Gynecology;  Laterality: Bilateral;     OB History    Gravida  3   Para  3   Term  3   Preterm      AB      Living  3     SAB      IAB      Ectopic      Multiple  0   Live Births  3           Family History  Problem Relation Age of Onset  . ADD / ADHD Father   . Drug abuse Father   . Diabetes Maternal Grandfather   . Heart disease Paternal Grandfather   . Cancer Paternal Grandfather        MGM sister  . Heart failure Paternal Grandfather   . Drug abuse Paternal Grandmother     Social History   Tobacco Use  . Smoking status: Current Every Day Smoker    Packs/day: 0.25    Types: Cigarettes  . Smokeless tobacco: Never Used  Vaping Use  . Vaping Use: Every day  . Substances: Nicotine  Substance Use Topics  . Alcohol use: Not Currently    Comment:  socially   . Drug use: No    Types: Marijuana    Comment: last used 06/27/17    Home Medications Prior to Admission medications   Medication Sig Start Date End Date Taking? Authorizing Provider  acetaminophen (TYLENOL) 500 MG tablet Take 1,000 mg by mouth every 6 (six) hours as needed.   Yes [provider]  ibuprofen (ADVIL) 800 MG tablet Take 1 tablet (800 mg total) by mouth every 8 (eight) hours as needed. Patient not taking: Reported on 10/31/2020 03/07/20   Cheron Schaumann K, PA-C  LATUDA 20 MG TABS tablet Take 20 mg by mouth every morning.  Patient not taking: Reported on 10/31/2020 12/18/18   [provider]  LATUDA 40 MG TABS tablet Take 40 mg by mouth at bedtime.  Patient not taking: Reported on 10/31/2020 10/23/18   [provider]    Allergies    Patient has no known allergies.  Review of Systems   Review of Systems  Constitutional:  Negative.   HENT: Negative.   Respiratory: Negative.   Cardiovascular: Negative.   Gastrointestinal: Positive for abdominal pain, diarrhea and nausea. Negative for anal bleeding, blood in stool, constipation and vomiting.  Genitourinary: Negative.   Musculoskeletal: Negative.   Skin: Negative.   Neurological: Negative.   Hematological: Negative.     Physical Exam Updated Vital Signs BP (!) 113/57 (BP Location: Right Arm)   Pulse 83   Temp 98 F (36.7 C) (Oral)   Resp 18   Ht 5\' 4"  (1.626 m)   Wt 127 kg   LMP 10/17/2020   SpO2 99%   BMI 48.06 kg/m   Physical Exam Vitals and nursing note reviewed.  Constitutional:      Appearance: She is morbidly obese. She is not ill-appearing or toxic-appearing.  HENT:     Head: Normocephalic and atraumatic.     Nose: Nose normal.     Mouth/Throat:     Mouth: Mucous membranes are moist.     Pharynx: Oropharynx is clear. Uvula midline. No oropharyngeal exudate, posterior oropharyngeal erythema or uvula swelling.     Tonsils: No tonsillar exudate.  Eyes:     General: Lids are normal. Vision grossly intact.        Right eye: No discharge.        Left eye: No discharge.     Extraocular Movements: Extraocular movements intact.     Conjunctiva/sclera: Conjunctivae normal.     Pupils: Pupils are equal, round, and reactive to light.  Neck:     Trachea: Trachea and phonation normal.  Cardiovascular:     Rate and Rhythm: Normal rate and regular rhythm.     Pulses: Normal pulses.     Heart sounds: Normal heart sounds. No murmur heard.   Pulmonary:     Effort: Pulmonary effort is normal. No tachypnea, bradypnea, accessory muscle usage, prolonged expiration or respiratory distress.     Breath sounds: Normal breath sounds. No wheezing or rales.  Chest:     Chest wall: No mass, lacerations, deformity, swelling, tenderness, crepitus or edema.  Abdominal:     General: Bowel sounds are normal. There is no distension.     Tenderness: There is  abdominal tenderness in the epigastric area, left upper quadrant and left lower quadrant. There is no right CVA tenderness, left CVA tenderness, guarding or rebound.  Musculoskeletal:        General: No deformity.     Cervical back: Normal range of motion and neck supple. No  edema, rigidity or crepitus. No pain with movement, spinous process tenderness or muscular tenderness.     Right lower leg: No edema.     Left lower leg: No edema.  Lymphadenopathy:     Cervical: No cervical adenopathy.  Skin:    General: Skin is warm and dry.  Neurological:     General: No focal deficit present.     Mental Status: She is alert and oriented to person, place, and time. Mental status is at baseline.     Sensory: Sensation is intact.     Motor: Motor function is intact.     Gait: Gait is intact.  Psychiatric:        Mood and Affect: Mood normal.     ED Results / Procedures / Treatments   Labs (all labs ordered are listed, but only abnormal results are displayed) Labs Reviewed  CBC WITH DIFFERENTIAL/PLATELET  PREGNANCY, URINE  COMPREHENSIVE METABOLIC PANEL  LIPASE, BLOOD  URINALYSIS, ROUTINE W REFLEX MICROSCOPIC    EKG None  Radiology No results found.  Procedures Procedures   Medications Ordered in ED Medications  dicyclomine (BENTYL) injection 10 mg (10 mg Intramuscular Given 10/31/20 2014)    ED Course  I have reviewed the triage vital signs and the nursing notes.  Pertinent labs & imaging results that were available during my care of the patient were reviewed by me and considered in my medical decision making (see chart for details).    MDM Rules/Calculators/A&P                         25 year old female with history of chronic abdominal pain and nausea presents with concern for sudden worsening of her abdominal pain today, with associated nausea.  Differential diagnosis includes but is not limited to gastritis, cholecystitis, colitis, IBS, diverticulitis, mesenteric  lymphadenitis, mesenteric ischemia, pyelonephritis, ovarian torsion or cyst, PID, AAA.  Vital signs are normal on intake.  Cardiopulmonary exam is normal, abdominal exam is benign.  Patient is generally tender in the abdomen, worse in the left upper and lower quadrants without rebound or guarding.  There is no pelvic tenderness to palpation, and as patient is not having any suprapubic pain or vaginal symptoms, do not feel GU exam is warranted at this time.  While in the room patient did experience an episode of abdominal cramping.  Will administer dose of Bentyl and proceed with basic laboratory studies.  Patient reevaluated with worsening abdominal pain, and persistent tenderness to palpation despite pain medication.  We will proceed with narcotic pain medication, antiemetic, and CT scan of the abdomen.  Care of this patient signed out to oncoming ED provider, Claudette Stapler, PA-C at time of shift change.  All pertinent HPI, physical exam, and laboratory studies were reviewed with her prior to my departure.  Please see her associated note for completion of this patient's ED course.    Jasiya voiced understanding of her medical evaluation and treatment plan thus far.  Each of her questions was answered to her expressed satisfaction.  She understands that a another ED provider will be completing her work-up and treatment plan.  This chart was dictated using voice recognition software, Dragon. Despite the best efforts of this provider to proofread and correct errors, errors may still occur which can change documentation meaning.  Final Clinical Impression(s) / ED Diagnoses Final diagnoses:  None    Rx / DC Orders ED Discharge Orders    None  Paris Lore, PA-C 10/31/20 2044    Paris Lore, PA-C 10/31/20 2051    Gerhard Munch, MD 11/03/20 2138

## 2020-10-31 NOTE — ED Provider Notes (Signed)
Assumed care from [facility], PA-C at shift change pending CT abdomen. See her note for full HPI.   In short, patient is a 25 year old female who presents to the ED due to intermittent, crampy abdominal pain associated with loose stool that has been intermittent for the past few years.  Abdominal pain associated with nausea.  No emesis.  Patient denies hematemesis, melena, hematochezia.  Patient denies any vaginal or urinary symptoms.   Plan from previous provider: if CT abdomen normal, patient may be discharged with outpatient follow-up. Previous provider had low suspicion for pelvic etiology given location of pain.  Physical Exam  BP (!) 113/57 (BP Location: Right Arm)   Pulse 83   Temp 98 F (36.7 C) (Oral)   Resp 18   Ht 5\' 4"  (1.626 m)   Wt 127 kg   LMP 10/17/2020   SpO2 99%   BMI 48.06 kg/m   Physical Exam Vitals and nursing note reviewed.  Constitutional:      General: She is not in acute distress.    Appearance: She is not ill-appearing.  HENT:     Head: Normocephalic.  Eyes:     Pupils: Pupils are equal, round, and reactive to light.  Cardiovascular:     Rate and Rhythm: Normal rate and regular rhythm.     Pulses: Normal pulses.     Heart sounds: Normal heart sounds. No murmur heard. No friction rub. No gallop.   Pulmonary:     Effort: Pulmonary effort is normal.     Breath sounds: Normal breath sounds.  Abdominal:     General: Abdomen is flat. There is no distension.     Palpations: Abdomen is soft.     Tenderness: There is abdominal tenderness. There is no guarding or rebound.  Musculoskeletal:        General: Normal range of motion.     Cervical back: Neck supple.  Skin:    General: Skin is warm and dry.  Neurological:     General: No focal deficit present.     Mental Status: She is alert.  Psychiatric:        Mood and Affect: Mood normal.        Behavior: Behavior normal.     ED Course/Procedures   Clinical Course as of 10/31/20 2150  Mon  Oct 31, 2020  2150 2151): SMALL [CA]  2150 Bacteria, UA: NONE SEEN [CA]  2150 WBC: 9.5 [CA]  2150 Preg Test, Ur: NEGATIVE [CA]  2150 Lipase: 21 [CA]    Clinical Course User Index [CA] 2151, PA-C   Results for orders placed or performed during the hospital encounter of 10/31/20 (from the past 24 hour(s))  Urinalysis, Routine w reflex microscopic Urine, Clean Catch     Status: Abnormal   Collection Time: 10/31/20  8:08 PM  Result Value Ref Range   Color, Urine YELLOW YELLOW   APPearance CLOUDY (A) CLEAR   Specific Gravity, Urine 1.021 1.005 - 1.030   pH 5.0 5.0 - 8.0   Glucose, UA NEGATIVE NEGATIVE mg/dL   Hgb urine dipstick NEGATIVE NEGATIVE   Bilirubin Urine NEGATIVE NEGATIVE   Ketones, ur NEGATIVE NEGATIVE mg/dL   Protein, ur NEGATIVE NEGATIVE mg/dL   Nitrite NEGATIVE NEGATIVE   Leukocytes,Ua SMALL (A) NEGATIVE   RBC / HPF 0-5 0 - 5 RBC/hpf   WBC, UA 6-10 0 - 5 WBC/hpf   Bacteria, UA NONE SEEN NONE SEEN   Squamous Epithelial / LPF 11-20 0 - 5  Mucus PRESENT   Pregnancy, urine     Status: None   Collection Time: 10/31/20  8:08 PM  Result Value Ref Range   Preg Test, Ur NEGATIVE NEGATIVE  CBC with Differential     Status: None   Collection Time: 10/31/20  8:34 PM  Result Value Ref Range   WBC 9.5 4.0 - 10.5 K/uL   RBC 4.70 3.87 - 5.11 MIL/uL   Hemoglobin 13.4 12.0 - 15.0 g/dL   HCT 37.6 28.3 - 15.1 %   MCV 87.0 80.0 - 100.0 fL   MCH 28.5 26.0 - 34.0 pg   MCHC 32.8 30.0 - 36.0 g/dL   RDW 76.1 60.7 - 37.1 %   Platelets 250 150 - 400 K/uL   nRBC 0.0 0.0 - 0.2 %   Neutrophils Relative % 70 %   Neutro Abs 6.7 1.7 - 7.7 K/uL   Lymphocytes Relative 24 %   Lymphs Abs 2.3 0.7 - 4.0 K/uL   Monocytes Relative 5 %   Monocytes Absolute 0.5 0.1 - 1.0 K/uL   Eosinophils Relative 1 %   Eosinophils Absolute 0.1 0.0 - 0.5 K/uL   Basophils Relative 0 %   Basophils Absolute 0.0 0.0 - 0.1 K/uL   Immature Granulocytes 0 %   Abs Immature Granulocytes 0.03  0.00 - 0.07 K/uL    Procedures  MDM  Assumed care from Waynard Edwards, PA-C at shift change pending CT abdomen. See her note for full MDM.  25 year old female presents to the ED due to acute on chronic abdominal pain that has been ongoing for the past few years.  No previous abdominal pain work-up.  On arrival, patient afebrile, not tachycardic or hypoxic.  Patient in no acute distress and nontoxic-appearing.  Abdomen soft, nondistended with diffuse abdominal tenderness.  No rebound or guarding.  CBC unremarkable no leukocytosis and normal hemoglobin.  Lipase normal at 21.  Doubt pancreatitis.  CMP reassuring with normal renal function no major electrolyte derangements.  UA significant for small leukocytes.  No bacteria.  Doubt acute cystitis.  No hematuria. CT abdomen personally reviewed which demonstrates: IMPRESSION:  1. Fluid-filled nondilated small bowel in the pelvis with suspicion  of bowel wall thickening as may be seen with enteritis of infectious  or inflammatory etiology.  2. Splenomegaly as before   Given abdominal pain appears to be chronic in nature, lower suspicion for infectious etiology. Will hold off on antibiotics at this time. Patient agreeable to plan. Patient discharged with Zofran and Bentyl.  GI number given to patient at discharge and instructed to call tomorrow to schedule an appointment for further evaluation. Strict ED precautions discussed with patient. Patient states understanding and agrees to plan. Patient discharged home in no acute distress and stable vitals.        Jesusita Oka 10/31/20 2230    Gerhard Munch, MD 11/03/20 2139

## 2020-10-31 NOTE — Discharge Instructions (Addendum)
It was a pleasure taking care of your today. As discussed, all of your labs were reassuring. Your CT scan showed inflammation in your bowels. I am sending you home with nausea medication and pain medication.  Take as needed.  I have included the number of the GI doctor.  Please call tomorrow to schedule appoint for further evaluation.  Return to the ER for new or worsening symptoms.

## 2020-10-31 NOTE — ED Triage Notes (Signed)
Pt with sharp, stabbing upper abd pain that radiates down.  Pt states it comes and goes.  +nausea and diarrhea (chronic)

## 2021-01-03 IMAGING — US OBSTETRIC <14 WK ULTRASOUND
1 series · 14 of 28 positions shown · non-contrast
Comparison: None.

CLINICAL DATA: Pelvic cramping positive urine pregnancy test

EXAM:
OBSTETRIC <14 WK US AND TRANSVAGINAL OB US
TECHNIQUE: Both transabdominal and transvaginal ultrasound examinations were
performed for complete evaluation of the gestation as well as the
maternal uterus, adnexal regions, and pelvic cul-de-sac.
Transvaginal technique was performed to assess early pregnancy.

[Series 1: obstetric <14 wk ultrasound · 0.23mm/px · 14 of 151 slices shown]
[im 6/151]
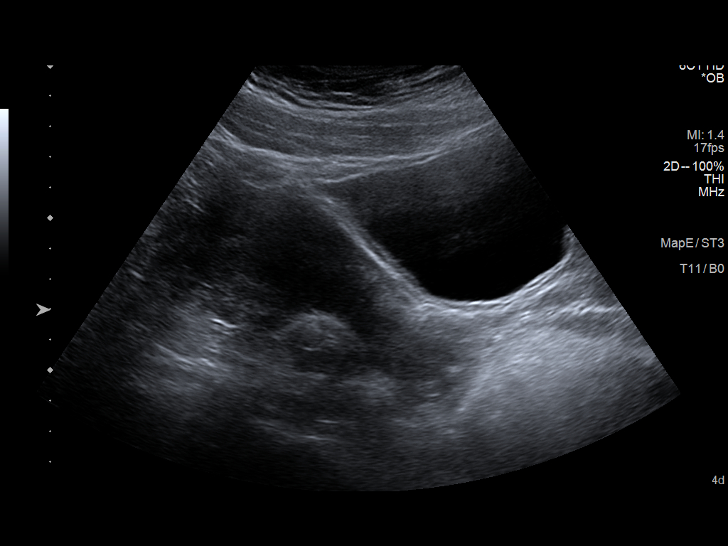
[im 17/151]
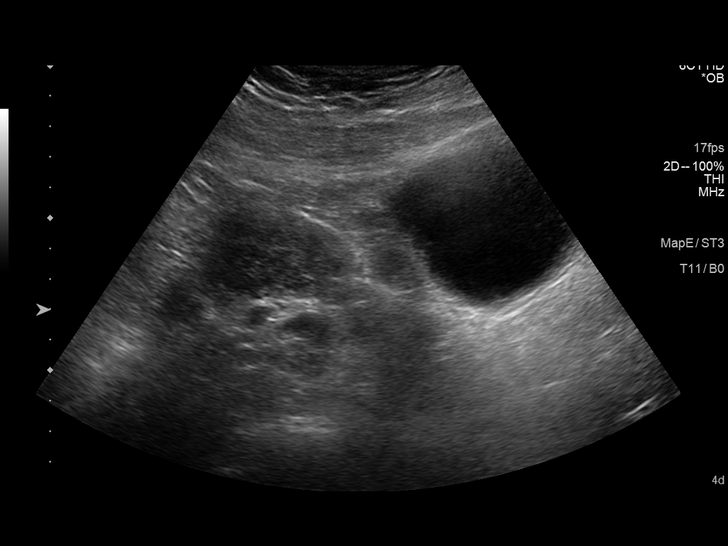
[im 28/151]
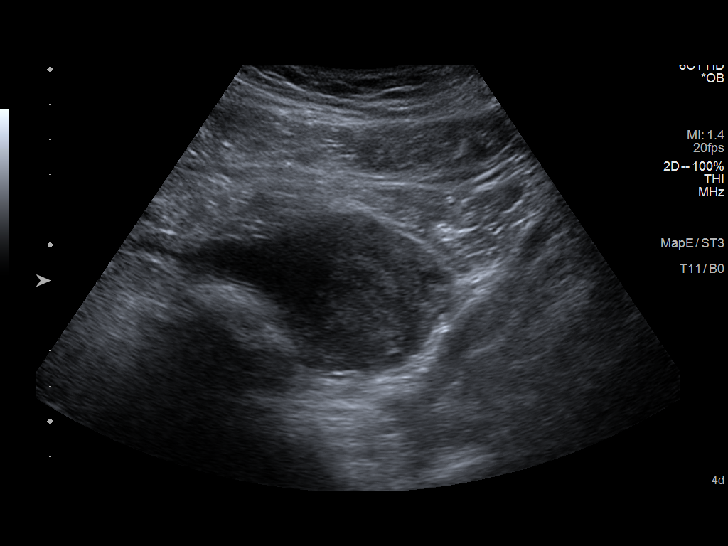
[im 39/151]
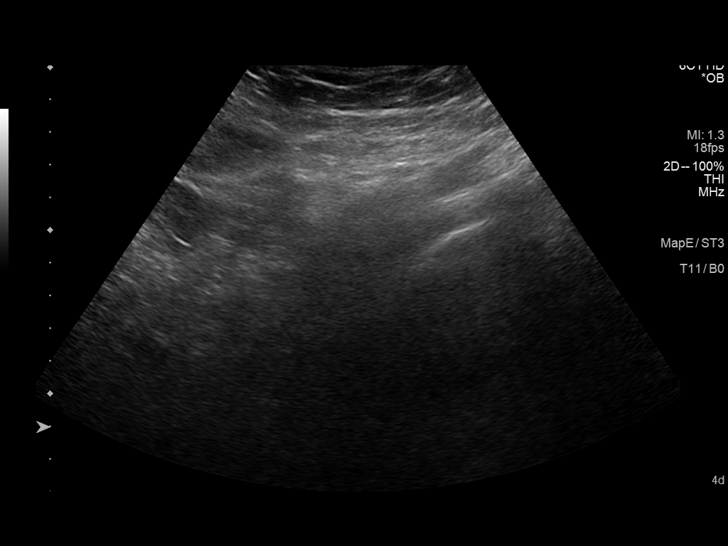
[im 51/151]
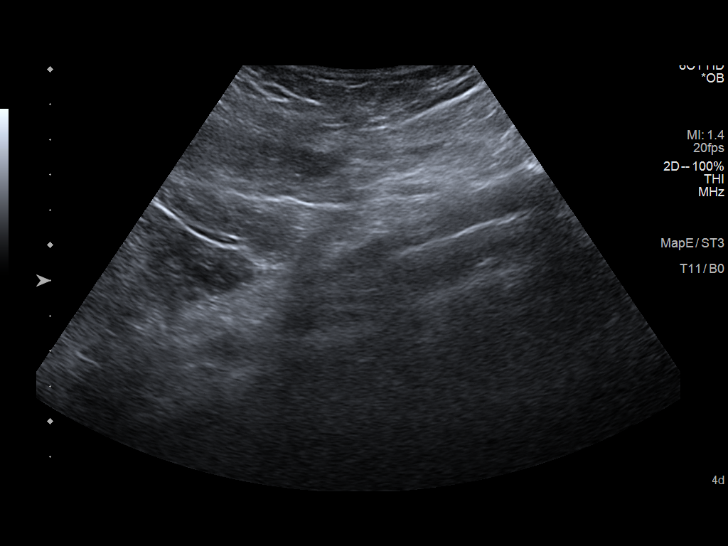
[im 62/151]
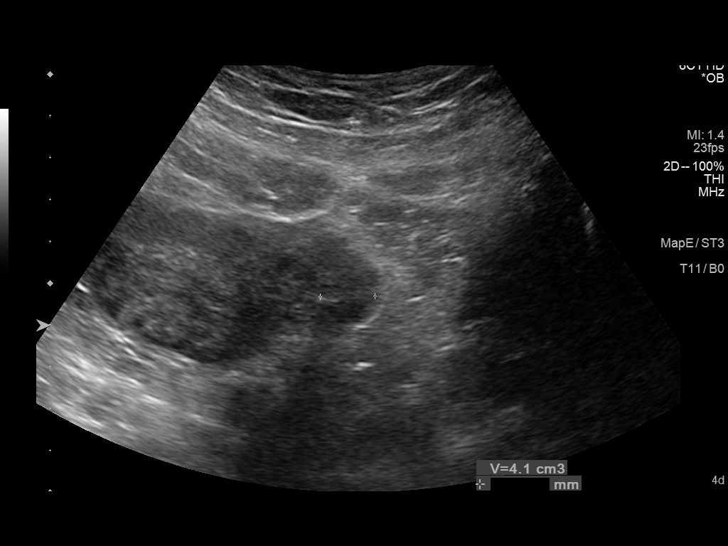
[im 73/151]
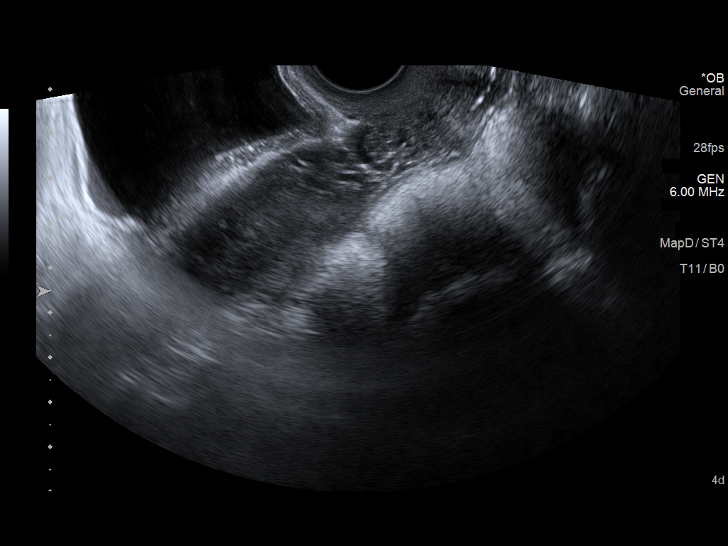
[im 84/151]
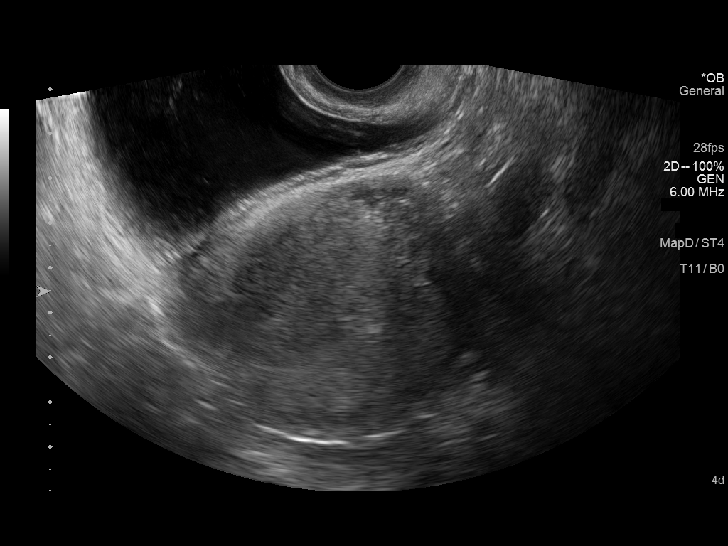
[im 95/151]
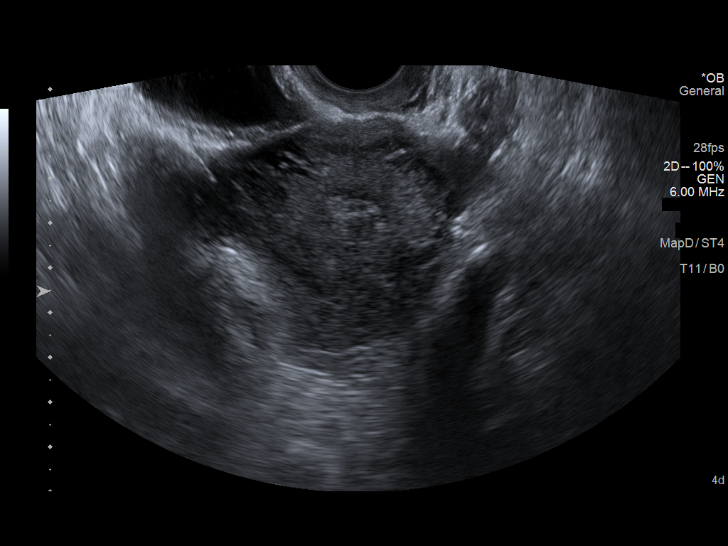
[im 106/151]
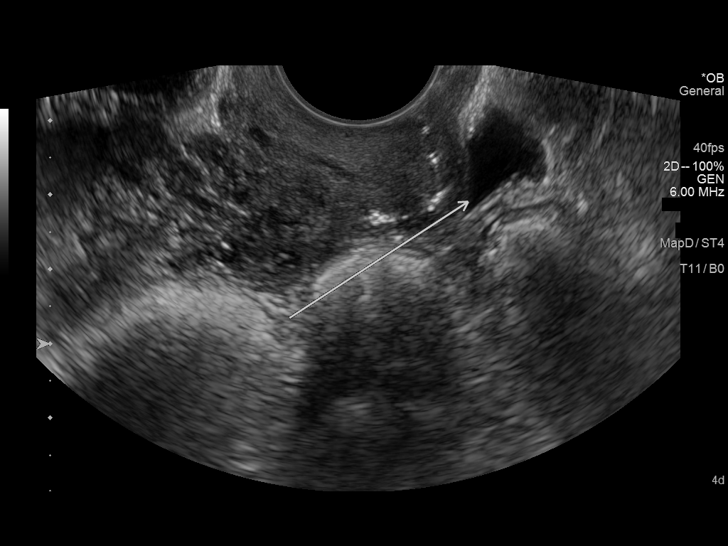
[im 117/151]
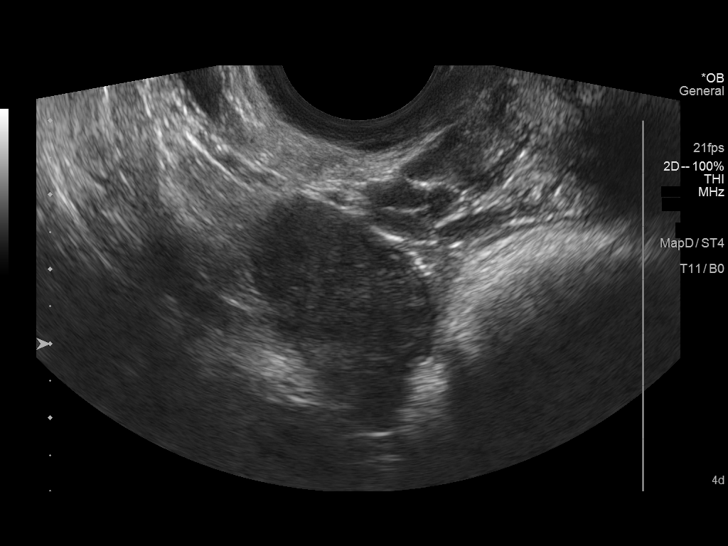
[im 128/151]
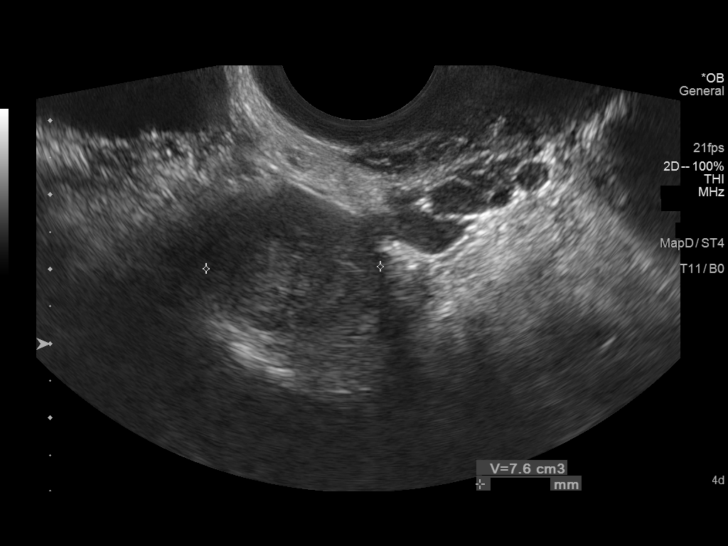
[im 139/151]
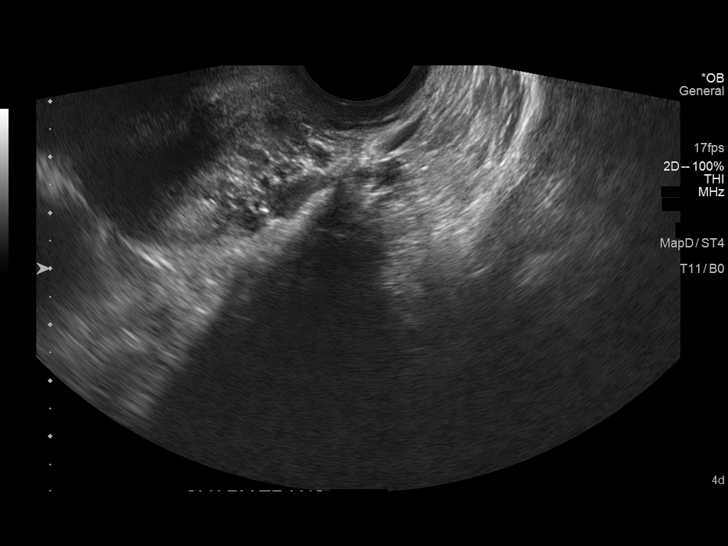
[im 151/151]
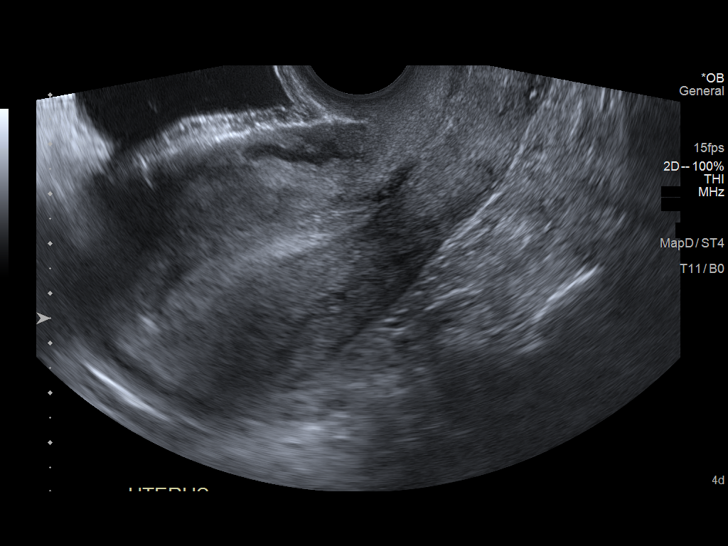

[14 of 28 positions shown; findings below may reference images not displayed]

FINDINGS: Intrauterine gestational sac: None

Yolk sac:  Not Visualized.

Embryo:  Not Visualized.

Maternal uterus/adnexae: Ovaries are within normal limits. The right
ovary measures 3.4 x 1.8 x 2.3 cm with a volume of 7.6 mL. The left
ovary is visualized trans abdominally and measures 3.4 x 1.8 x
cm with a volume of 4.1 mL.

There is thickening of the endometrial stripe up to 15 mm with small
amount of endometrial fluid at the fundus but no discrete
gestational sac.
IMPRESSION: No definitive IUP identified. Findings are consistent with pregnancy
of unknown location, differential of which includes IUP too early to
visualize, recent failed pregnancy, and occult ectopic pregnancy.
Recommend trending of HCG with repeat ultrasound as indicated

## 2022-11-07 IMAGING — CT CT ABD-PELV W/ CM
2 of 4 series · 16 of 46 positions shown, 18 images · IV contrast (Omnipaque or Isovue)
Comparison: CT 06/29/2017

CLINICAL DATA: Left upper quadrant pain

EXAM:
CT ABDOMEN AND PELVIS WITH CONTRAST
TECHNIQUE: Multidetector CT imaging of the abdomen and pelvis was performed
using the standard protocol following bolus administration of
intravenous contrast.
CONTRAST:  100mL OMNIPAQUE IOHEXOL 300 MG/ML  SOLN

[Series 2: axial st · axial · 0.66mm/px · z∈[-598,-163]mm · 13 of 95 slices shown, 15 images]
[im 4/95  soft-tissue]
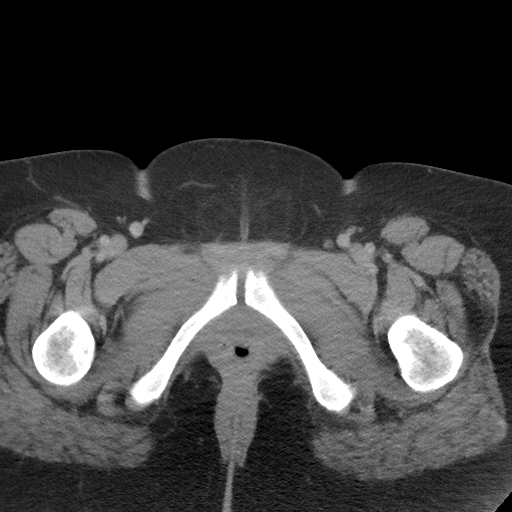
[im 4/95  bone]
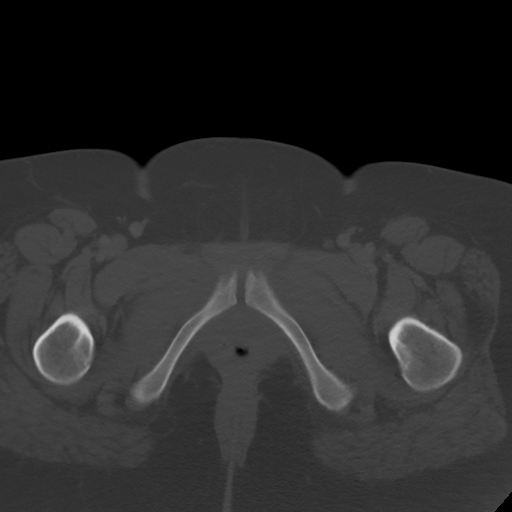
[im 12/95  soft-tissue]
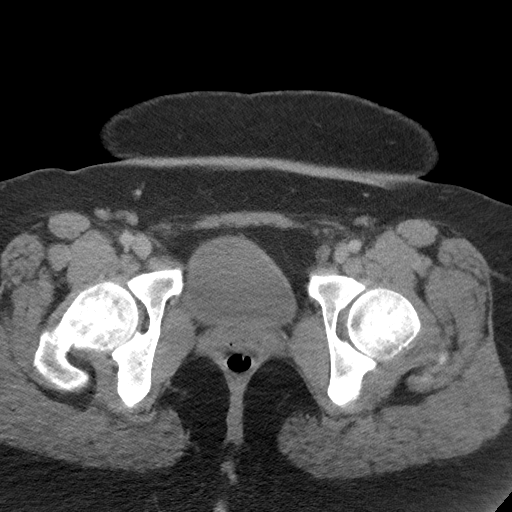
[im 20/95  soft-tissue]
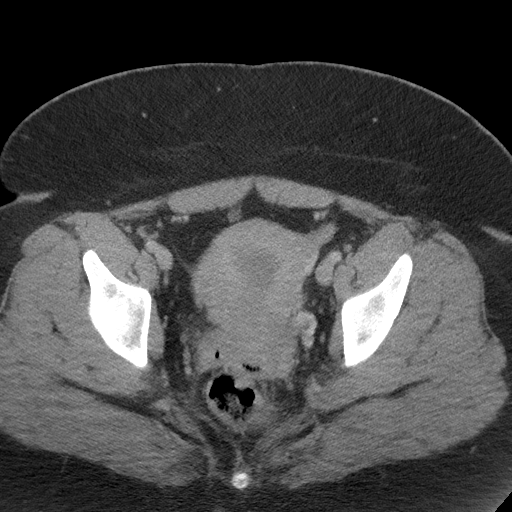
[im 28/95  soft-tissue]
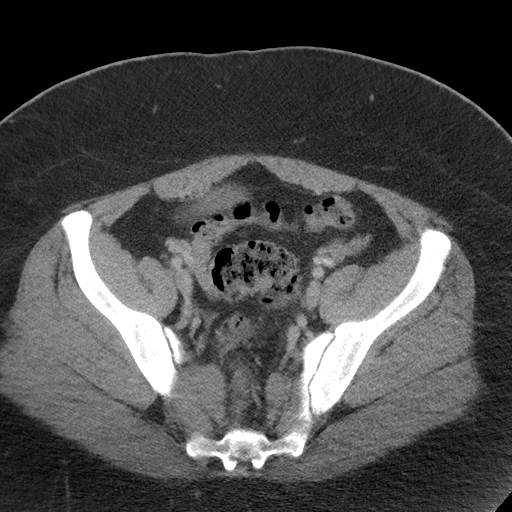
[im 32/95  soft-tissue]
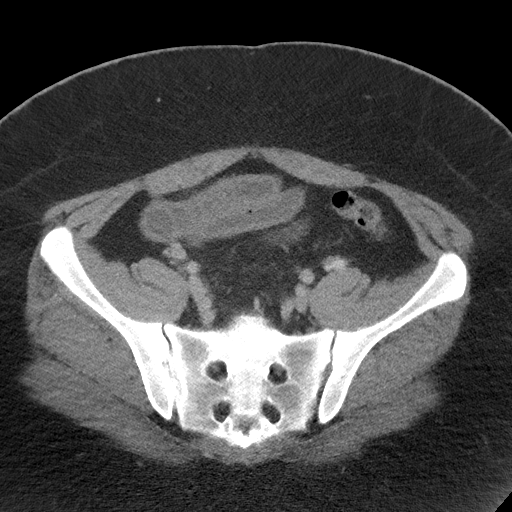
[im 40/95  soft-tissue]
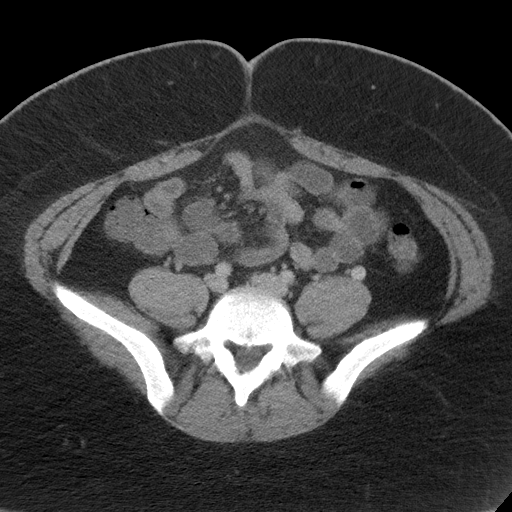
[im 48/95  soft-tissue]
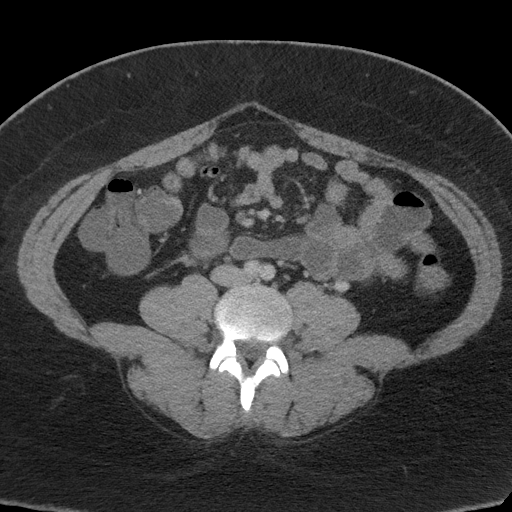
[im 55/95  soft-tissue]
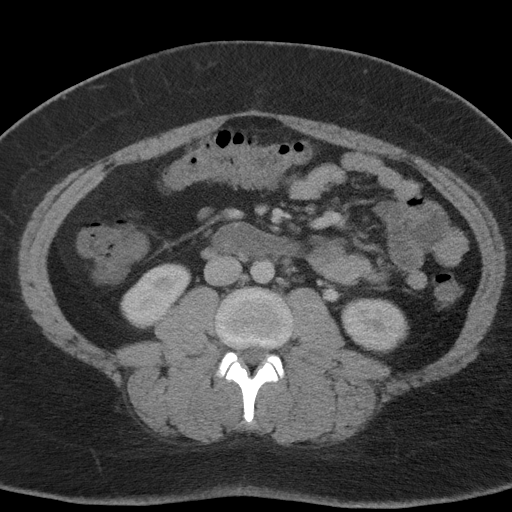
[im 63/95  soft-tissue]
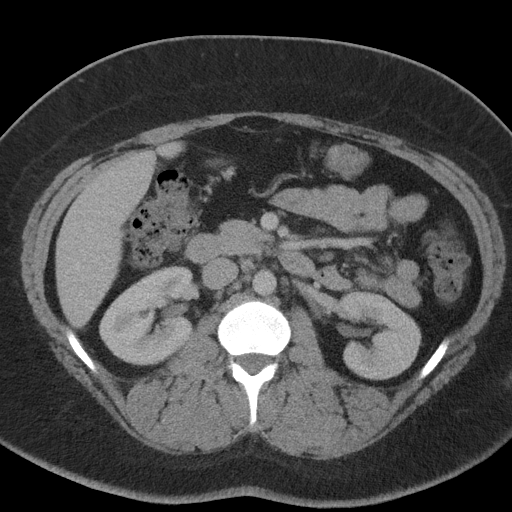
[im 63/95  bone]
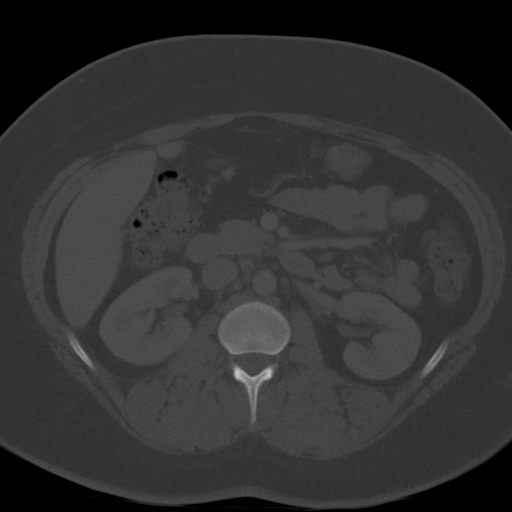
[im 67/95  soft-tissue]
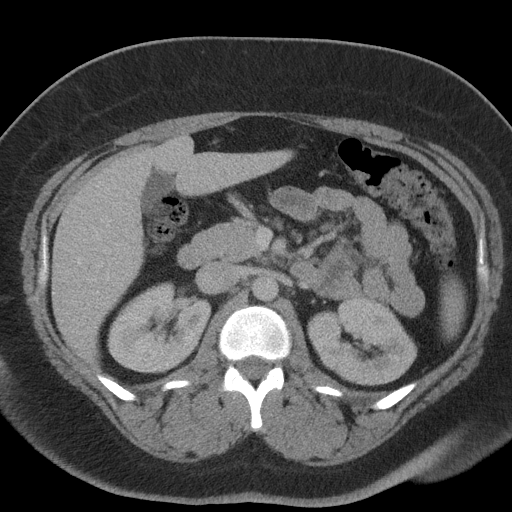
[im 75/95  soft-tissue]
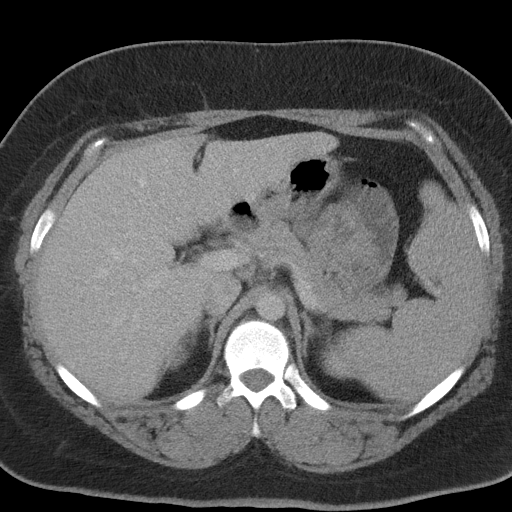
[im 83/95  soft-tissue]
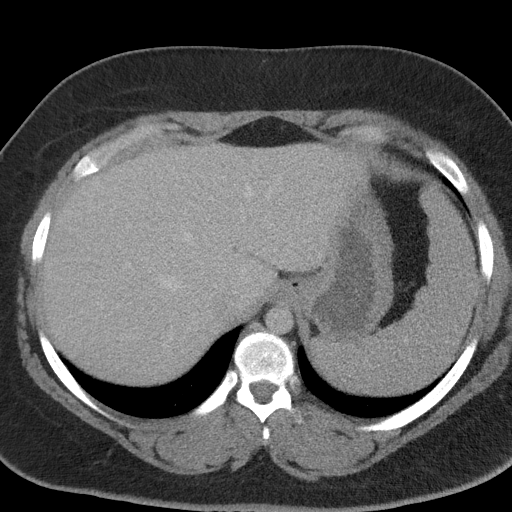
[im 91/95  soft-tissue]
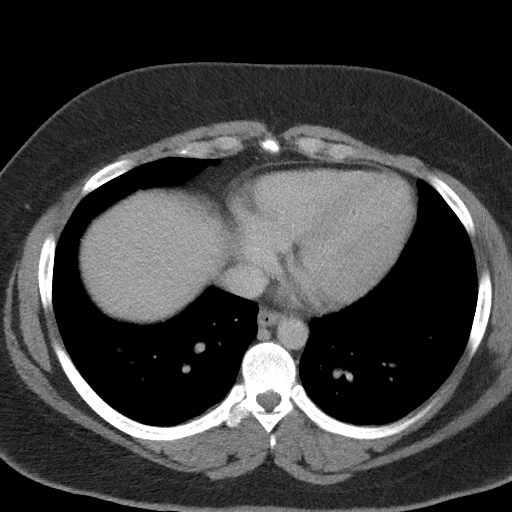

[Series 5: coronal st · coronal · 0.83mm/px · 3 of 90 slices shown]
[im 30/90  soft-tissue]
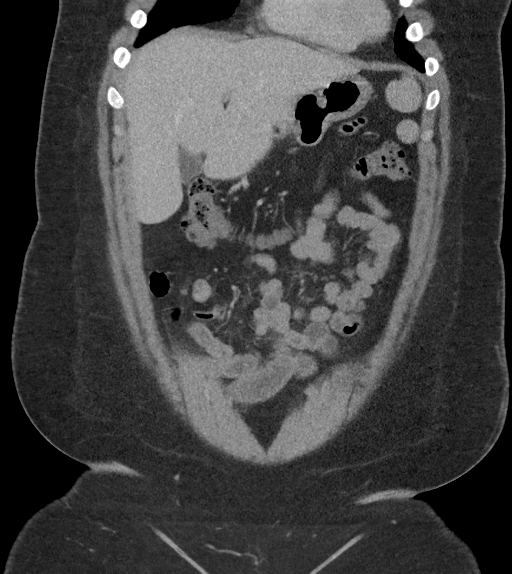
[im 40/90  soft-tissue]
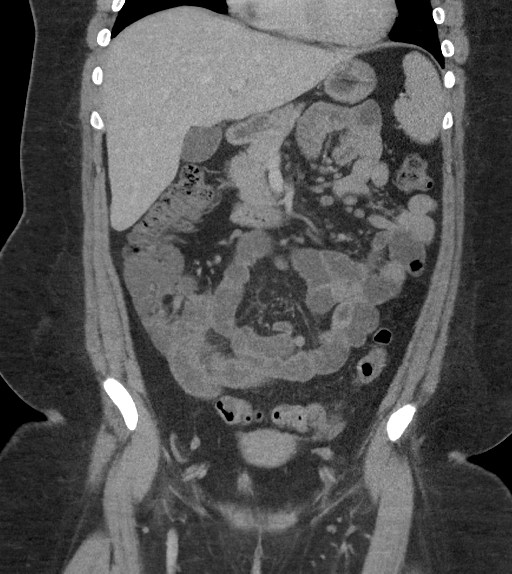
[im 50/90  soft-tissue]
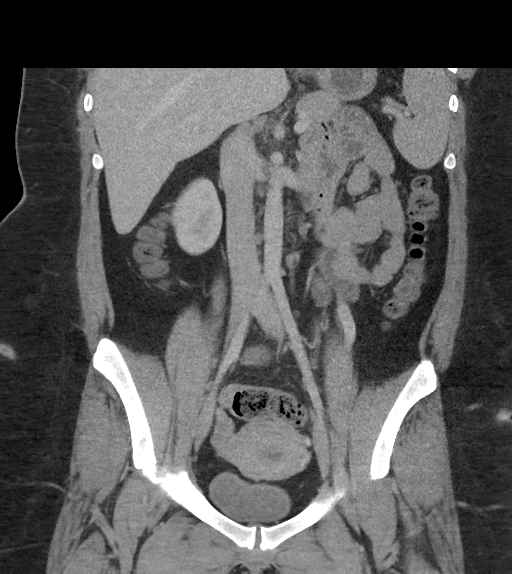

[16 of 46 positions shown; findings below may reference images not displayed]

FINDINGS: Lower chest: Lung bases demonstrate no acute consolidation or
effusion. Borderline cardiomegaly.

Hepatobiliary: No focal liver abnormality is seen. No gallstones,
gallbladder wall thickening, or biliary dilatation.

Pancreas: Unremarkable. No pancreatic ductal dilatation or
surrounding inflammatory changes.

Spleen: Slightly enlarged, measuring 16.5 cm AP.  Splenic granuloma

Adrenals/Urinary Tract: Adrenal glands are unremarkable. Kidneys are
normal, without renal calculi, focal lesion, or hydronephrosis.
Bladder is unremarkable.

Stomach/Bowel: The stomach is nonenlarged. No dilated small bowel.
Fluid-filled distal small bowel in the pelvis with suspected mild
wall thickening, series 2, image 63. No colon wall thickening.

Vascular/Lymphatic: No significant vascular findings are present. No
enlarged abdominal or pelvic lymph nodes. Multiple subcentimeter
retroperitoneal nodes and mesenteric nodes.

Reproductive: Uterus and bilateral adnexa are unremarkable.

Other: Negative for free air or free fluid. A small nodule anterior
to the uterus measures 16 mm craniocaudad and is slightly smaller
compared to the previous exam. Resolution of previously noted
mesenteric edema/soft tissue stranding.

Musculoskeletal: No acute or significant osseous findings.
IMPRESSION: 1. Fluid-filled nondilated small bowel in the pelvis with suspicion
of bowel wall thickening as may be seen with enteritis of infectious
or inflammatory etiology.
2. Splenomegaly as before
